# Patient Record
Sex: Male | Born: 1960 | Race: White | Hispanic: No | Marital: Married | Smoking: Former smoker
Health system: Southern US, Community
[De-identification: ages and names within clinical notes are randomized; demographics above are authoritative.]

## PROBLEM LIST (undated history)

## (undated) DIAGNOSIS — F1010 Alcohol abuse, uncomplicated: Secondary | ICD-10-CM

## (undated) DIAGNOSIS — J449 Chronic obstructive pulmonary disease, unspecified: Secondary | ICD-10-CM

## (undated) DIAGNOSIS — F32A Depression, unspecified: Secondary | ICD-10-CM

## (undated) DIAGNOSIS — E7800 Pure hypercholesterolemia, unspecified: Secondary | ICD-10-CM

## (undated) DIAGNOSIS — I6529 Occlusion and stenosis of unspecified carotid artery: Secondary | ICD-10-CM

## (undated) DIAGNOSIS — E663 Overweight: Secondary | ICD-10-CM

## (undated) DIAGNOSIS — IMO0002 Reserved for concepts with insufficient information to code with codable children: Secondary | ICD-10-CM

## (undated) DIAGNOSIS — E785 Hyperlipidemia, unspecified: Secondary | ICD-10-CM

## (undated) DIAGNOSIS — Z87891 Personal history of nicotine dependence: Secondary | ICD-10-CM

## (undated) DIAGNOSIS — F329 Major depressive disorder, single episode, unspecified: Secondary | ICD-10-CM

## (undated) DIAGNOSIS — Q231 Congenital insufficiency of aortic valve: Secondary | ICD-10-CM

## (undated) DIAGNOSIS — R011 Cardiac murmur, unspecified: Secondary | ICD-10-CM

## (undated) DIAGNOSIS — M545 Low back pain: Secondary | ICD-10-CM

## (undated) DIAGNOSIS — G8929 Other chronic pain: Secondary | ICD-10-CM

## (undated) DIAGNOSIS — R918 Other nonspecific abnormal finding of lung field: Secondary | ICD-10-CM

## (undated) DIAGNOSIS — Z953 Presence of xenogenic heart valve: Secondary | ICD-10-CM

## (undated) DIAGNOSIS — E041 Nontoxic single thyroid nodule: Secondary | ICD-10-CM

## (undated) DIAGNOSIS — I351 Nonrheumatic aortic (valve) insufficiency: Secondary | ICD-10-CM

## (undated) DIAGNOSIS — I350 Nonrheumatic aortic (valve) stenosis: Secondary | ICD-10-CM

## (undated) DIAGNOSIS — Z720 Tobacco use: Secondary | ICD-10-CM

## (undated) DIAGNOSIS — E559 Vitamin D deficiency, unspecified: Secondary | ICD-10-CM

## (undated) DIAGNOSIS — M5030 Other cervical disc degeneration, unspecified cervical region: Secondary | ICD-10-CM

## (undated) DIAGNOSIS — L0591 Pilonidal cyst without abscess: Secondary | ICD-10-CM

## (undated) DIAGNOSIS — A0472 Enterocolitis due to Clostridium difficile, not specified as recurrent: Secondary | ICD-10-CM

## (undated) HISTORY — DX: Other nonspecific abnormal finding of lung field: R91.8

## (undated) HISTORY — DX: Nonrheumatic aortic (valve) stenosis: I35.0

## (undated) HISTORY — DX: Other chronic pain: G89.29

## (undated) HISTORY — DX: Reserved for concepts with insufficient information to code with codable children: IMO0002

## (undated) HISTORY — DX: Tobacco use: Z72.0

## (undated) HISTORY — DX: Vitamin D deficiency, unspecified: E55.9

## (undated) HISTORY — PX: BACK SURGERY: SHX140

## (undated) HISTORY — DX: Chronic obstructive pulmonary disease, unspecified: J44.9

## (undated) HISTORY — DX: Alcohol abuse, uncomplicated: F10.10

## (undated) HISTORY — DX: Cardiac murmur, unspecified: R01.1

## (undated) HISTORY — DX: Depression, unspecified: F32.A

## (undated) HISTORY — DX: Nonrheumatic aortic (valve) insufficiency: I35.1

## (undated) HISTORY — DX: Major depressive disorder, single episode, unspecified: F32.9

## (undated) HISTORY — DX: Other cervical disc degeneration, unspecified cervical region: M50.30

## (undated) HISTORY — DX: Pilonidal cyst without abscess: L05.91

## (undated) HISTORY — DX: Enterocolitis due to Clostridium difficile, not specified as recurrent: A04.72

## (undated) HISTORY — DX: Hyperlipidemia, unspecified: E78.5

## (undated) HISTORY — DX: Congenital insufficiency of aortic valve: Q23.1

## (undated) HISTORY — DX: Personal history of nicotine dependence: Z87.891

## (undated) HISTORY — PX: HERNIA REPAIR: SHX51

## (undated) HISTORY — DX: Nontoxic single thyroid nodule: E04.1

## (undated) HISTORY — DX: Pure hypercholesterolemia, unspecified: E78.00

## (undated) HISTORY — DX: Overweight: E66.3

## (undated) HISTORY — DX: Occlusion and stenosis of unspecified carotid artery: I65.29

## (undated) HISTORY — DX: Low back pain: M54.5

---

## 2002-02-06 ENCOUNTER — Emergency Department (HOSPITAL_COMMUNITY): Admission: EM | Admit: 2002-02-06 | Discharge: 2002-02-06 | Payer: Self-pay

## 2012-04-29 ENCOUNTER — Encounter (INDEPENDENT_AMBULATORY_CARE_PROVIDER_SITE_OTHER): Payer: Self-pay | Admitting: General Surgery

## 2012-04-30 ENCOUNTER — Ambulatory Visit (INDEPENDENT_AMBULATORY_CARE_PROVIDER_SITE_OTHER): Payer: No Typology Code available for payment source | Admitting: General Surgery

## 2012-04-30 ENCOUNTER — Encounter (INDEPENDENT_AMBULATORY_CARE_PROVIDER_SITE_OTHER): Payer: Self-pay | Admitting: General Surgery

## 2012-04-30 VITALS — BP 122/78 | HR 71 | Temp 96.4°F | Resp 18 | Ht 72.0 in | Wt 189.6 lb

## 2012-04-30 DIAGNOSIS — L0591 Pilonidal cyst without abscess: Secondary | ICD-10-CM | POA: Insufficient documentation

## 2012-04-30 HISTORY — DX: Pilonidal cyst without abscess: L05.91

## 2012-04-30 NOTE — Progress Notes (Addendum)
Patient ID: [name] [name], male   DOB: [birth year], 52 y.o.   MRN: [id number]  Chief Complaint  Patient presents with  . New Evaluation    eval pilo cyst    HPI [name] [name] is a 52 y.o. male.   HPI 52 yo WM referred by Dr Caffie Damme at [facility] for evaluation of pilonidal disease. The patient states that he started having a problem in the area about 9 months ago. It occurred at the same time that he was hospitalized for an  Foreign abscess in the epidural space of the lumbar spine. He states he was in the intensive care unit for about a week at [facility]. He states that he received 8 weeks of IV antibiotics. He was followed by an infectious disease physician. During that time he had an area on his left medial blowout that became very swollen and tender. He was given an antibiotic which essentially resolved the swelling and tenderness. It essentially went away. Since that time he has had several flares of where he would get swollen and tender and draining occasionally. Right now he denies any pain or tenderness in the area. He states that it just feels like a solid lump right now. He denies any prior incision and drainage in the area. He denies any trauma to the area. He denies any fever or chills. He denies any weight loss. He does smoke about one pack per day. He no longer drinks. He has an upcoming echocardiogram to evaluate his aortic stenosis. He denies any diarrhea or constipation. Past Medical History  Diagnosis Date  . Depression   . Aortic stenosis   . DDD (degenerative disc disease), cervical   . Hypercholesteremia   . Heart murmur     Past Surgical History  Procedure Laterality Date  . Back surgery  1999/2003    Family History  Problem Relation Age of Onset  . Hyperlipidemia Father   . Heart disease Father   . Hyperlipidemia Mother   . Heart disease Mother   . Colon cancer Brother   . Heart disease Sister   . Cancer Brother     Colon  . Cancer Brother    Colon    Social History History  Substance Use Topics  . Smoking status: Current Every Day Smoker -- 1.00 packs/day    Types: Cigarettes  . Smokeless tobacco: Never Used  . Alcohol Use: No    Allergies  Allergen Reactions  . Morphine And Related     Patient states he wants nothing NARCOTIC r/t past abuse to drugs/alcohol.    Current Outpatient Prescriptions  Medication Sig Dispense Refill  . gabapentin (NEURONTIN) 800 MG tablet Take 800 mg by mouth 3 (three) times daily.      . MELOXICAM PO Take 1 tablet by mouth daily.      . rosuvastatin (CRESTOR) 10 MG tablet Take 10 mg by mouth daily.      Marland Kitchen venlafaxine XR (EFFEXOR-XR) 75 MG 24 hr capsule Take 75 mg by mouth daily.       No current facility-administered medications for this visit.    Review of Systems Review of Systems  Constitutional: Negative for fever, chills, appetite change and unexpected weight change.  HENT: Negative for congestion and trouble swallowing.   Eyes: Negative for visual disturbance.  Respiratory: Negative for chest tightness and shortness of breath.   Cardiovascular: Negative for chest pain and leg swelling.       No PND, no orthopnea, no  DOE  Gastrointestinal:       See HPI  Genitourinary: Negative for dysuria and hematuria.  Musculoskeletal: Negative.   Skin: Negative for rash.  Neurological: Negative for seizures and speech difficulty.       Had an episode of Left eye transient blindness a few months ago, lasted for less than 1 min then blurry for a few minutes and resolved; reports an episode of Rt arm weakness/numbness a few months ago that last 4-5 hours. "couldn't even hold a pen".   Hematological: Does not bruise/bleed easily.  Psychiatric/Behavioral: Negative for behavioral problems and confusion.    Blood pressure 122/78, pulse 71, temperature 96.4 F (35.8 C), temperature source Temporal, resp. rate 18, height 6' (1.829 m), weight 189 lb 9.6 oz (86.002 kg).  Physical Exam Physical  Exam  Vitals reviewed. Constitutional: He is oriented to person, place, and time. He appears well-developed and well-nourished. No distress.  HENT:  Head: Normocephalic and atraumatic.  Right Ear: External ear normal.  Eyes: Conjunctivae are normal. No scleral icterus.  Neck: No tracheal deviation present. No thyromegaly present.  Cardiovascular: Normal rate and regular rhythm.   Murmur (III/VI SEM) heard. Pulmonary/Chest: Effort normal. No respiratory distress. He has no wheezes.  Abdominal: Soft. He exhibits no distension. There is no tenderness.  Musculoskeletal: He exhibits no edema and no tenderness.  Lymphadenopathy:    He has no cervical adenopathy.  Neurological: He is alert and oriented to person, place, and time. He exhibits normal muscle tone.  Skin: Skin is warm and dry. No rash noted. He is not diaphoretic. No erythema.  In upper gluteal cleft, tiny <42mm sinus. About 1 cm to left of sinus is area of about 2cm of induration. No fluctuance/cellulitis. Fair amount of hair in area; lower back midline incision  Psychiatric: He has a normal mood and affect. His behavior is normal. Judgment and thought content normal.    Data Reviewed Dr Michaelle Copas note from 04/23/12  Assessment    Pilonidal sinus Possible Left amaurosis fugax and h/o TIA    Plan    He does have pilonidal disease. He was given Agricultural engineer. We discussed nonoperative as well as operative management. There is no current sign of infection or inflammation. With respect to nonoperative management we discussed keeping the area clean and dry. We also discussed trying to keep area free of hair by using an electric trimmer or a depilatory cream. With respect to surgery we discussed surgical excision of the skin and soft tissue in the area. I explained that there is no one standard how to approach this type of problem surgically. We discussed the most common complication after surgery of some type of wound healing  situation. We discussed the risk and benefits of surgery including but not limited to bleeding, infection, chronic wound, blood clot formation and anesthesia risk, scarring, recurrence, as well as the typical postoperative recovery course.  I recommend starting with nonoperative management. We discussed strategies.  In his review of systems, it appears that he may have had transient TIA and/or transient left eye blindness. I've encouraged him to followup with his primary care physician as well as his cardiologist regarding the symptoms he has had. We discussed their potential implication and what he should do should these recur.  I will leave his appointment open-ended. Followup as needed  I contacted and spoke with his primary care physician Dr. Katrinka Blazing and informed her of his positive review of systems for transient left eye blindness as well as  right upper extremity weakness.  Mary Sella. Andrey Campanile, MD, FACS General, Bariatric, & Minimally Invasive Surgery [facility], [place]        [facility] M 04/30/2012, 10:17 AM

## 2012-04-30 NOTE — Patient Instructions (Signed)
Talk with your cardiologist or primary care doctor about the Left eye blindness and right arm weakness  Pilonidal Cyst A pilonidal cyst occurs when hairs get trapped (ingrown) beneath the skin in the crease between the buttocks over your sacrum (the bone under that crease). Pilonidal cysts are most common in young men with a lot of body hair. When the cyst is ruptured (breaks) or leaking, fluid from the cyst may cause burning and itching. If the cyst becomes infected, it causes a painful swelling filled with pus (abscess). The pus and trapped hairs need to be removed (often by lancing) so that the infection can heal. However, recurrence is common and an operation may be needed to remove the cyst. HOME CARE INSTRUCTIONS   If the cyst was NOT INFECTED:  Keep the area clean and dry. Bathe or shower daily. Wash the area well with a germ-killing soap. Warm tub baths may help prevent infection and help with drainage. Dry the area well with a towel.  Avoid tight clothing to keep area as moisture free as possible.  Keep area between buttocks as free of hair as possible. A depilatory may be used.  If the cyst WAS INFECTED and needed to be drained:  Your caregiver packed the wound with gauze to keep the wound open. This allows the wound to heal from the inside outwards and continue draining.  Return for a wound check in 1 day or as suggested.  If you take tub baths or showers, repack the wound with gauze following them. Sponge baths (at the sink) are a good alternative.  If an antibiotic was ordered to fight the infection, take as directed.  Only take over-the-counter or prescription medicines for pain, discomfort, or fever as directed by your caregiver.  After the drain is removed, use sitz baths for 20 minutes 4 times per day. Clean the wound gently with mild unscented soap, pat dry, and then apply a dry dressing. SEEK MEDICAL CARE IF:   You have increased pain, swelling, redness, drainage, or  bleeding from the area.  You have a fever.  You have muscles aches, dizziness, or a general ill feeling. Document Released: 02/16/2000 Document Revised: 05/13/2011 Document Reviewed: 04/15/2008 [facility] Patient Information 2013 [place], [place].

## 2012-10-22 LAB — PULMONARY FUNCTION TEST

## 2012-11-30 ENCOUNTER — Encounter: Payer: Self-pay | Admitting: Thoracic Surgery (Cardiothoracic Vascular Surgery)

## 2012-11-30 ENCOUNTER — Institutional Professional Consult (permissible substitution) (INDEPENDENT_AMBULATORY_CARE_PROVIDER_SITE_OTHER): Payer: Medicare Other | Admitting: Thoracic Surgery (Cardiothoracic Vascular Surgery)

## 2012-11-30 ENCOUNTER — Telehealth: Payer: Self-pay | Admitting: *Deleted

## 2012-11-30 ENCOUNTER — Encounter: Payer: Self-pay | Admitting: Cardiovascular Disease

## 2012-11-30 VITALS — BP 146/86 | HR 67 | Resp 16 | Ht 72.0 in | Wt 185.0 lb

## 2012-11-30 DIAGNOSIS — F329 Major depressive disorder, single episode, unspecified: Secondary | ICD-10-CM

## 2012-11-30 DIAGNOSIS — I359 Nonrheumatic aortic valve disorder, unspecified: Secondary | ICD-10-CM

## 2012-11-30 DIAGNOSIS — M545 Low back pain, unspecified: Secondary | ICD-10-CM

## 2012-11-30 DIAGNOSIS — R5383 Other fatigue: Secondary | ICD-10-CM

## 2012-11-30 DIAGNOSIS — I350 Nonrheumatic aortic (valve) stenosis: Secondary | ICD-10-CM

## 2012-11-30 DIAGNOSIS — I351 Nonrheumatic aortic (valve) insufficiency: Secondary | ICD-10-CM | POA: Insufficient documentation

## 2012-11-30 DIAGNOSIS — F32A Depression, unspecified: Secondary | ICD-10-CM

## 2012-11-30 DIAGNOSIS — Z01818 Encounter for other preprocedural examination: Secondary | ICD-10-CM

## 2012-11-30 DIAGNOSIS — IMO0002 Reserved for concepts with insufficient information to code with codable children: Secondary | ICD-10-CM

## 2012-11-30 DIAGNOSIS — E785 Hyperlipidemia, unspecified: Secondary | ICD-10-CM

## 2012-11-30 DIAGNOSIS — E7800 Pure hypercholesterolemia, unspecified: Secondary | ICD-10-CM

## 2012-11-30 DIAGNOSIS — G8929 Other chronic pain: Secondary | ICD-10-CM

## 2012-11-30 DIAGNOSIS — Q231 Congenital insufficiency of aortic valve: Secondary | ICD-10-CM | POA: Insufficient documentation

## 2012-11-30 HISTORY — DX: Other chronic pain: G89.29

## 2012-11-30 HISTORY — DX: Nonrheumatic aortic (valve) stenosis: I35.0

## 2012-11-30 HISTORY — DX: Reserved for concepts with insufficient information to code with codable children: IMO0002

## 2012-11-30 HISTORY — DX: Hyperlipidemia, unspecified: E78.5

## 2012-11-30 NOTE — H&P (Signed)
[address]       [name] [name] [address]             [phone]     CARDIOTHORACIC SURGERY CONSULTATION REPORT  Referring Provider is Sherrill Raring, MD PCP is Caffie Damme, MD  Chief Complaint  Patient presents with  . Aortic Stenosis    Surgical eval on severe AS,TEE 11/24/12, 2D Echo 11/19/12    HPI:  Patient is a 52 year old disabled white male from [facility] with bicuspid aortic valve disease including aortic stenosis and aortic insufficiency who has been referred for possible elective surgical intervention.  The patient states that he was first noted to have a heart murmur on physical exam performed by his neurosurgeon in 1999. He has been followed by Dr. Hanley Hays for the last several years with serial echocardiograms which have revealed normal LV systolic function and severe aortic stenosis. Initially the patient remained asymptomatic, although recently he has noticed some changes in his exercise tolerance with mild exertional shortness of breath.  He underwent a stress echocardiogram 11/19/2012. Patient had poor exercise tolerance stopping only 4 1/2 minutes into a standard Bruce protocol due to shortness of breath.  Peak exercise echo findings demonstrated that the patient's transvalvular gradient across the aortic valve reportedly increased to greater than 90 mm mercury post exercise up from baseline 60-70 mm mercury.  The patient subsequently underwent transesophageal echocardiogram confirming the presence of bicuspid aortic valve with moderate to severe aortic stenosis and moderate to severe aortic regurgitation. Left ventricular size and systolic function appeared normal with ejection fraction estimated 60-65%. Patient has been referred for elective surgical consultation.  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 He is somewhat limited by chronic pain  but overall he states that he gets around the quite well.  Over the last couple of years he has appreciated significant worsening of exertional shortness of breath. He only gets short of breath with strenuous activity such as mowing the lawn. He does not get short of breath with normal activity. He has not had any chest pain or chest tightness either with activity or at rest. He denies any PND, orthopnea, or lower extremity edema. He has not had any dizzy spells or syncope.  Past Medical History  Diagnosis Date  . Depression   . Aortic stenosis   . DDD (degenerative disc disease), cervical   . Hypercholesteremia   . Heart murmur   . Bicuspid aortic valve   . Aortic insufficiency   . Alcohol abuse   . Vitamin D deficiency   . C. difficile colitis   . Overweight   . Thyroid nodule   . Former smoker   . Carotid stenosis     MILD  . Pilonidal cyst 04/30/2012  . Severe aortic stenosis 11/30/2012  . Degenerative disc disease 11/30/2012  . Hyperlipidemia 11/30/2012  . Chronic low back pain 11/30/2012  . Degenerative disc disease, lumbar 11/30/2012    Past Surgical History  Procedure Laterality Date  . Back surgery  1999/2003    Family History  Problem Relation Age of Onset  . Hyperlipidemia Father   . Heart disease Father   . Hyperlipidemia Mother   . Heart disease Mother   . Colon cancer Brother   . Heart disease Sister   . Cancer Brother     Colon  . Cancer Brother  Colon    History   Social History  . Marital Status: Married    Spouse Name: N/A    Number of Children: N/A  . Years of Education: N/A   Occupational History  . Not on file.   Social History Main Topics  . Smoking status: Current Every Day Smoker -- 1.00 packs/day for 25 years    Types: Cigarettes    Start date: 12/01/1987  . Smokeless tobacco: Never Used  . Alcohol Use: No  . Drug Use: Not on file  . Sexual Activity: Not on file   Other Topics Concern  . Not on file   Social History Narrative    . No narrative on file    Current Outpatient Prescriptions  Medication Sig Dispense Refill  . gabapentin (NEURONTIN) 800 MG tablet Take 800 mg by mouth 3 (three) times daily.      . MELOXICAM PO Take 1 tablet by mouth daily.      . rosuvastatin (CRESTOR) 10 MG tablet Take 10 mg by mouth daily.      Marland Kitchen venlafaxine XR (EFFEXOR-XR) 75 MG 24 hr capsule Take 75 mg by mouth daily.       No current facility-administered medications for this visit.    Allergies  Allergen Reactions  . Morphine And Related     Patient states he wants nothing NARCOTIC r/t past abuse to drugs/alcohol.      Review of Systems:   General:  normal appetite, normal energy, no weight gain, + weight loss, no fever  Cardiac:  no chest pain with exertion, no chest pain at rest, + SOB with strenuous exertion, no resting SOB, no PND, no orthopnea, no palpitations, no arrhythmia, no atrial fibrillation, no LE edema, no dizzy spells, no syncope  Respiratory:  + exertional shortness of breath, no home oxygen, no productive cough, + chronic dry cough, no bronchitis, no wheezing, no hemoptysis, no asthma, no pain with inspiration or cough, no sleep apnea, no no CPAP at night  GI:   no difficulty swallowing, no reflux, no frequent heartburn, no hiatal hernia, no abdominal pain, no constipation, no diarrhea, no hematochezia, no hematemesis, no melena  GU:   no dysuria,  no frequency, no urinary tract infection, no hematuria, no enlarged prostate, no kidney stones, no kidney disease  Vascular:  no pain suggestive of claudication, no pain in feet, + leg cramps, no varicose veins, no DVT, no non-healing foot ulcer  Neuro:   no stroke, no TIA's, no seizures, no headaches, no temporary blindness one eye,  no slurred speech, no peripheral neuropathy, + chronic pain, no instability of gait, no memory/cognitive dysfunction  Musculoskeletal: + arthritis in back and neck, no joint swelling, no myalgias, no difficulty walking, normal mobility    Skin:   no rash, no itching, no skin infections, no pressure sores or ulcerations  Psych:   no anxiety, + depression, no nervousness, no unusual recent stress  Eyes:   no blurry vision, no floaters, no recent vision changes, does not wear glasses or contacts  ENT:   no hearing loss, no loose or painful teeth, no dentures, last saw dentist 2011  Hematologic:  no easy bruising, no abnormal bleeding, no clotting disorder, no frequent epistaxis  Endocrine:  no diabetes, does not check CBG's at home     Physical Exam:   BP 146/86  Pulse 67  Resp 16  Ht 6' (1.829 m)  Wt 185 lb (83.915 kg)  BMI 25.08 kg/m2  SpO2 98%  General:    well-appearing  HEENT:  Unremarkable   Neck:   no JVD, no bruits, no adenopathy   Chest:   clear to auscultation, symmetrical breath sounds, no wheezes, no rhonchi   CV:   RRR, grade III/VI crescendo/decrescendo systolic murmur and early diastolic murmur  Abdomen:  soft, non-tender, no masses   Extremities:  warm, well-perfused, pulses diminished but palpable, no LE edema  Rectal/GU  Deferred  Neuro:   Grossly non-focal and symmetrical throughout  Skin:   Clean and dry, no rashes, no breakdown   Diagnostic Tests:  TRANSESOPHAGEAL ECHOCARDIOGRAM  Both the images and the report from transesophageal echocardiogram performed 11/24/2012 are reviewed. The patient has congenitally bicuspid aortic valve with moderate to severe aortic stenosis and at least moderate aortic insufficiency. Left ventricular size and systolic function appear normal.  Ejection fraction was estimated 60-65%. Peak velocity across the aortic valve was measured 3.8 m/s.The peak and mean transvalvular gradients were estimated to be 59 and 31 mm mercury respectively. Valve area by planimetry was approximately 1.0 cm.  The valve leaflets were relatively thin but demonstrated decreased leaflet mobility and fusion of the left and right cusps of the valve. The aortic root appeared somewhat  enlarged.    STRESS ECHOCARDIOGRAM  The patient reportedly exercised for 4 minutes 30 seconds into a standard Bruce protocol at which time the patient achieved a maximum heart rate of 125 beats per minute. The test was stopped because of severe shortness of breath. The patient's baseline transvalvular gradient was reportedly 60-70 mm mercury, and this increased to greater than 90 mm mercury post exercise. Left ventricular systolic function appeared normal with ejection fraction 60%. No wall motion abnormalities were seen with exercise. There were no ST T wave changes seen with exercise by ECG.   Impression:  The patient has congenitally bicuspid aortic valve with severe symptomatic aortic stenosis and at least moderate aortic insufficiency. Left ventricular systolic function appears preserved. I agree that it makes sense to proceed with elective aortic valve replacement in the near future.   Plan:  The patient was counseled at length regarding surgical alternatives with respect to valve replacement including continued medical therapy versus proceeding with conventional surgical aortic valve replacement using either a mechanical prosthesis or a bioprosthetic tissue valve.  Other alternatives including the Ross autograft procedure, homograft aortic root replacement, stentless bioprosthetic tissue valve replacement, valve repair, and transcatheter aortic valve replacement were discussed.  Discussion was held comparing the relative risks of mechanical valve replacement with need for lifelong anticoagulation versus use of a bioprosthetic tissue valve and the associated potential for late structural valve deterioration in failure.  The patient will undergo left and right heart catheterization in the near future by Dr. Allyson Sabal. We will obtain CT angiogram of the chest abdomen and pelvis to rule out the possibility of significant aneurysmal enlargement of the ascending thoracic aorta and to investigate access  options for possible enema invasive approach for surgery. The patient also undergo pulmonary function testing. He has been reminded how important it will be for him to quit smoking. He also has been reminded to see his dentist in the near future for routine dental cleaning and exam.     [name]. Cornelius Moras, MD 11/30/2012 2:53 PM

## 2012-11-30 NOTE — Telephone Encounter (Signed)
Dr Allyson Sabal spoke with Dr Barry Dienes and wanted Dr Allyson Sabal to do a right and left heart cath on Mr [name] [name]. I spoke with Mr Boeke and verified that he is ready to proceed with the cath.  He verbalized understanding and is ready to proceed.

## 2012-11-30 NOTE — Patient Instructions (Addendum)
Schedule left and right heart cath with Dr Allyson Sabal as soon as practical  Have routine dental exam and cleaning as soon as practical

## 2012-12-01 ENCOUNTER — Encounter (HOSPITAL_COMMUNITY): Payer: Self-pay | Admitting: Pharmacy Technician

## 2012-12-01 ENCOUNTER — Other Ambulatory Visit: Payer: Self-pay | Admitting: *Deleted

## 2012-12-01 DIAGNOSIS — I350 Nonrheumatic aortic (valve) stenosis: Secondary | ICD-10-CM

## 2012-12-01 DIAGNOSIS — I719 Aortic aneurysm of unspecified site, without rupture: Secondary | ICD-10-CM

## 2012-12-03 ENCOUNTER — Other Ambulatory Visit: Payer: Self-pay | Admitting: *Deleted

## 2012-12-03 ENCOUNTER — Encounter: Payer: Self-pay | Admitting: *Deleted

## 2012-12-03 DIAGNOSIS — Z79899 Other long term (current) drug therapy: Secondary | ICD-10-CM

## 2012-12-08 ENCOUNTER — Inpatient Hospital Stay (HOSPITAL_COMMUNITY)
Admission: RE | Admit: 2012-12-08 | Discharge: 2012-12-08 | Disposition: A | Discharge status: Home / Self Care | Payer: Medicare Other | Source: Ambulatory Visit | Attending: Thoracic Surgery (Cardiothoracic Vascular Surgery) | Admitting: Thoracic Surgery (Cardiothoracic Vascular Surgery)

## 2012-12-08 ENCOUNTER — Ambulatory Visit (HOSPITAL_COMMUNITY)
Admission: RE | Admit: 2012-12-08 | Discharge: 2012-12-08 | Disposition: A | Discharge status: Home / Self Care | Payer: Medicare Other | Source: Ambulatory Visit | Attending: Thoracic Surgery (Cardiothoracic Vascular Surgery) | Admitting: Thoracic Surgery (Cardiothoracic Vascular Surgery)

## 2012-12-08 ENCOUNTER — Encounter (HOSPITAL_COMMUNITY): Payer: Self-pay

## 2012-12-08 DIAGNOSIS — I719 Aortic aneurysm of unspecified site, without rupture: Secondary | ICD-10-CM

## 2012-12-08 DIAGNOSIS — I350 Nonrheumatic aortic (valve) stenosis: Secondary | ICD-10-CM

## 2012-12-08 DIAGNOSIS — E041 Nontoxic single thyroid nodule: Secondary | ICD-10-CM | POA: Insufficient documentation

## 2012-12-08 DIAGNOSIS — I359 Nonrheumatic aortic valve disorder, unspecified: Secondary | ICD-10-CM | POA: Insufficient documentation

## 2012-12-08 DIAGNOSIS — R918 Other nonspecific abnormal finding of lung field: Secondary | ICD-10-CM

## 2012-12-08 DIAGNOSIS — K5730 Diverticulosis of large intestine without perforation or abscess without bleeding: Secondary | ICD-10-CM | POA: Insufficient documentation

## 2012-12-08 DIAGNOSIS — R059 Cough, unspecified: Secondary | ICD-10-CM | POA: Insufficient documentation

## 2012-12-08 DIAGNOSIS — R05 Cough: Secondary | ICD-10-CM | POA: Insufficient documentation

## 2012-12-08 DIAGNOSIS — R0602 Shortness of breath: Secondary | ICD-10-CM | POA: Insufficient documentation

## 2012-12-08 HISTORY — DX: Other nonspecific abnormal finding of lung field: R91.8

## 2012-12-08 LAB — PULMONARY FUNCTION TEST

## 2012-12-08 MED ORDER — IOHEXOL 350 MG/ML SOLN
100.0000 mL | Freq: Once | INTRAVENOUS | Status: AC | PRN
  Administered 2012-12-08: 100 mL via INTRAVENOUS

## 2012-12-08 MED ORDER — ALBUTEROL SULFATE (5 MG/ML) 0.5% IN NEBU
2.5000 mg | INHALATION_SOLUTION | Freq: Once | RESPIRATORY_TRACT | Status: AC
  Administered 2012-12-08: 2.5 mg via RESPIRATORY_TRACT
  Filled 2012-12-08: qty 0.5

## 2012-12-10 ENCOUNTER — Ambulatory Visit (HOSPITAL_COMMUNITY)
Admission: RE | Admit: 2012-12-10 | Discharge: 2012-12-10 | Disposition: A | Discharge status: Home / Self Care | Payer: Medicare Other | Source: Ambulatory Visit | Attending: Cardiovascular Disease | Admitting: Cardiovascular Disease

## 2012-12-10 ENCOUNTER — Encounter (HOSPITAL_COMMUNITY)
Admission: RE | Disposition: A | Discharge status: Home / Self Care | Payer: Self-pay | Source: Ambulatory Visit | Attending: Cardiovascular Disease

## 2012-12-10 DIAGNOSIS — E785 Hyperlipidemia, unspecified: Secondary | ICD-10-CM | POA: Diagnosis present

## 2012-12-10 DIAGNOSIS — I351 Nonrheumatic aortic (valve) insufficiency: Secondary | ICD-10-CM | POA: Diagnosis present

## 2012-12-10 DIAGNOSIS — Q2381 Bicuspid aortic valve: Secondary | ICD-10-CM

## 2012-12-10 DIAGNOSIS — I2510 Atherosclerotic heart disease of native coronary artery without angina pectoris: Secondary | ICD-10-CM

## 2012-12-10 DIAGNOSIS — R599 Enlarged lymph nodes, unspecified: Secondary | ICD-10-CM | POA: Insufficient documentation

## 2012-12-10 DIAGNOSIS — I080 Rheumatic disorders of both mitral and aortic valves: Secondary | ICD-10-CM | POA: Insufficient documentation

## 2012-12-10 DIAGNOSIS — G8929 Other chronic pain: Secondary | ICD-10-CM | POA: Diagnosis present

## 2012-12-10 DIAGNOSIS — IMO0002 Reserved for concepts with insufficient information to code with codable children: Secondary | ICD-10-CM | POA: Diagnosis present

## 2012-12-10 DIAGNOSIS — R9439 Abnormal result of other cardiovascular function study: Secondary | ICD-10-CM | POA: Diagnosis present

## 2012-12-10 DIAGNOSIS — R591 Generalized enlarged lymph nodes: Secondary | ICD-10-CM | POA: Diagnosis present

## 2012-12-10 DIAGNOSIS — I723 Aneurysm of iliac artery: Secondary | ICD-10-CM | POA: Diagnosis present

## 2012-12-10 DIAGNOSIS — I359 Nonrheumatic aortic valve disorder, unspecified: Secondary | ICD-10-CM

## 2012-12-10 DIAGNOSIS — Q231 Congenital insufficiency of aortic valve: Secondary | ICD-10-CM | POA: Insufficient documentation

## 2012-12-10 DIAGNOSIS — R918 Other nonspecific abnormal finding of lung field: Secondary | ICD-10-CM | POA: Diagnosis present

## 2012-12-10 DIAGNOSIS — I350 Nonrheumatic aortic (valve) stenosis: Secondary | ICD-10-CM | POA: Diagnosis present

## 2012-12-10 DIAGNOSIS — F329 Major depressive disorder, single episode, unspecified: Secondary | ICD-10-CM | POA: Diagnosis present

## 2012-12-10 DIAGNOSIS — Z79899 Other long term (current) drug therapy: Secondary | ICD-10-CM

## 2012-12-10 DIAGNOSIS — F32A Depression, unspecified: Secondary | ICD-10-CM | POA: Diagnosis present

## 2012-12-10 DIAGNOSIS — M545 Low back pain, unspecified: Secondary | ICD-10-CM | POA: Diagnosis present

## 2012-12-10 DIAGNOSIS — Z8249 Family history of ischemic heart disease and other diseases of the circulatory system: Secondary | ICD-10-CM

## 2012-12-10 DIAGNOSIS — I739 Peripheral vascular disease, unspecified: Secondary | ICD-10-CM

## 2012-12-10 HISTORY — PX: CARDIAC CATHETERIZATION: SHX172

## 2012-12-10 HISTORY — PX: RIGHT HEART CATHETERIZATION: SHX5447

## 2012-12-10 LAB — BASIC METABOLIC PANEL
BUN: 15 mg/dL (ref 6–23)
Chloride: 103 mEq/L (ref 96–112)
Creatinine, Ser: 1.04 mg/dL (ref 0.50–1.35)
GFR calc Af Amer: >90 mL/min (ref >90)
GFR calc non Af Amer: 80 mL/min — ABNORMAL LOW (ref >90)
Glucose, Bld: 104 mg/dL — ABNORMAL HIGH (ref 70–99)

## 2012-12-10 LAB — POCT I-STAT 3, VENOUS BLOOD GAS (G3P V)
Acid-base deficit: 2 mmol/L (ref 0.0–2.0)
Bicarbonate: 25.5 mEq/L — ABNORMAL HIGH (ref 20.0–24.0)
Bicarbonate: 24.8 mEq/L — ABNORMAL HIGH (ref 20.0–24.0)
O2 Saturation: 63 %
TCO2: 27 mmol/L (ref 0–100)
TCO2: 26 mmol/L (ref 0–100)
pH, Ven: 7.322 — ABNORMAL HIGH (ref 7.250–7.300)
pO2, Ven: 36 mmHg (ref 30.0–45.0)

## 2012-12-10 LAB — CBC
HCT: 45.3 % (ref 39.0–52.0)
Hemoglobin: 16.5 g/dL (ref 13.0–17.0)
MCHC: 36.4 g/dL — ABNORMAL HIGH (ref 30.0–36.0)
MCV: 88.5 fL (ref 78.0–100.0)
RDW: 13 % (ref 11.5–15.5)
WBC: 9.5 10*3/uL (ref 4.0–10.5)

## 2012-12-10 LAB — PROTIME-INR: INR: 0.9 (ref 0.00–1.49)

## 2012-12-10 SURGERY — RIGHT HEART CATH

## 2012-12-10 MED ORDER — MIDAZOLAM HCL 2 MG/2ML IJ SOLN
INTRAMUSCULAR | Status: AC
  Filled 2012-12-10: qty 2

## 2012-12-10 MED ORDER — FENTANYL CITRATE 0.05 MG/ML IJ SOLN
INTRAMUSCULAR | Status: AC
  Filled 2012-12-10: qty 2

## 2012-12-10 MED ORDER — DIAZEPAM 5 MG PO TABS
5.0000 mg | ORAL_TABLET | ORAL | Status: AC
  Administered 2012-12-10: 5 mg via ORAL

## 2012-12-10 MED ORDER — NITROGLYCERIN 0.2 MG/ML ON CALL CATH LAB
INTRAVENOUS | Status: AC
  Filled 2012-12-10: qty 1

## 2012-12-10 MED ORDER — ASPIRIN 81 MG PO CHEW
81.0000 mg | CHEWABLE_TABLET | ORAL | Status: AC
  Administered 2012-12-10: 81 mg via ORAL

## 2012-12-10 MED ORDER — HEPARIN (PORCINE) IN NACL 2-0.9 UNIT/ML-% IJ SOLN
INTRAMUSCULAR | Status: AC
  Filled 2012-12-10: qty 1000

## 2012-12-10 MED ORDER — LIDOCAINE-EPINEPHRINE 1 %-1:100000 IJ SOLN
INTRAMUSCULAR | Status: AC
  Filled 2012-12-10: qty 1

## 2012-12-10 MED ORDER — SODIUM CHLORIDE 0.9 % IV SOLN
INTRAVENOUS | Status: DC
  Administered 2012-12-10: 09:00:00 via INTRAVENOUS

## 2012-12-10 MED ORDER — SODIUM CHLORIDE 0.9 % IJ SOLN: 3.0000 mL | INTRAMUSCULAR | Status: DC | PRN

## 2012-12-10 MED ORDER — LIDOCAINE HCL (PF) 1 % IJ SOLN
INTRAMUSCULAR | Status: AC
  Filled 2012-12-10: qty 30

## 2012-12-10 NOTE — H&P (Signed)
    Pt was reexamined and existing H & P reviewed. No changes found.  Runell Gess, MD [facility] 12/10/2012 10:38 AM

## 2012-12-10 NOTE — CV Procedure (Addendum)
[name] [name] is a 52 y.o. male    [id number] LOCATION:  FACILITY: [facility]  PHYSICIAN: Nanetta Batty, M.D. 07/04/1960   DATE OF PROCEDURE:  12/10/2012  DATE OF DISCHARGE:     CARDIAC CATHETERIZATION     History obtained from chart review.52 y/o with a history of a heart murmur and DOE. He has been followed by Dr Julianne Handler for bicuspid AOV with AS/MR. The pt denies chest pain. He has had DOE. A recent stress echo was abnormal with mod-severe AS and moderate MR and decreased exercise tolerance. TEE was performed after this. He has been referred for AVR and was seen by Dr Cornelius Moras 11/30/12. He is admitted now for Rt and Lt heart cath.  He denies any history of angina or syncope. He says he had a negative Myoview a year or so ago. He has a strong family history of CAD- his father and sister died of an MI in their 6s. He smokes a pack a day. He is disabled secondary to chronic back pain. 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 CT done 10/7 showed pulmonary nodules, pelvic lymphadenopathy, and bilateral iliac aneurysms    PROCEDURE DESCRIPTION:    The patient was brought to the second floor [place] [facility] cath lab in the postabsorptive state. He was premedicated with Valium 5 mg by mouth, IV Versed and fentanyl. His left groinwas prepped and shaved in usual sterile fashion. Xylocaine 1% was used for local anesthesia. A 5 French sheath was inserted into the left common femoral artery using standard Seldinger technique. A 7 French sheath was inserted into the left common femoral vein using standard Seldinger technique. A 7 French balloon tip thermal dilution Swan-Ganz catheter was then advanced to the right heart chambers obtaining sequential pressures, blood samples and thermal dilution cardiac output was obtained. A total of 80 cc of contrast was administered to the patient   HEMODYNAMICS:    AO SYSTOLIC/AO DIASTOLIC: 131/76   RA  pressure: 13/11, mean = 9  RV pressure: 37/10  Pulmonary artery pressure: 34/0, mean equals 23  Pulmonary capillary wedge pressure:24/22, mean equals 21  Cardiac output (Fick) equals 3.32 L per minute, 1.6 L per minute per meter squared  Cardiac output (demolition) 2.97 L per minute, 1.43 L per minute per meter squared  ANGIOGRAPHIC RESULTS:   1. Left main; normal  2. LAD; the LAD gave off a small first diagonal branch and a small to medium size second I will branch which had a 50-60% proximal stenosis 3. Left circumflex; codominant and free of significant disease. There was a small ramus branch that had diffuse moderate segmental proximal disease.  4. Right coronary artery; codominant with some aneurysmal dilatation in its proximal to midportion but no obstructive disease 5. Left ventriculography;not performed today 6: Abdominal aortogram-the distal abdominal aorta was widely patent. The iliac bifurcation, common and external iliac arteries were free of significant atherosclerotic or aneurysmal disease.  IMPRESSION:Mr. 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 Abdomen aortogram showed widely patent iliacs suitable for TAVR if necessary. The sheath were removed and pressure was held on the groin to achieve hemostasis. The patient left the lab in stable condition. He will be discharged home after remaining recumbent for 6 hours. Dsr. Ray Hanley Hays and Ashley Mariner  were notified of these results.   Runell Gess MD, [facility] 12/10/2012 11:31 AM

## 2012-12-10 NOTE — H&P (Signed)
Patient ID: [name] [name] MRN: [id number], DOB/AGE: April 12, 1960   Admit date: 12/10/2012   Primary Physician: Caffie Damme, MD Primary Cardiologist: Dr Julianne Handler  HPI:   52 y/o with a history of a heart murmur and DOE. He has been followed by Dr Julianne Handler for bicuspid AOV with AS/MR. The pt denies chest pain. He has had DOE. A recent stress echo was abnormal with mod-severe AS and moderate MR and decreased exercise tolerance. TEE was performed after this. He has been referred for AVR and was seen by Dr Cornelius Moras 11/30/12. He is admitted now for Rt and Lt heart cath.            He denies any history of angina or syncope. He says he had a negative Myoview a year or so ago. He has a strong family history of CAD- his father and sister died of an MI in their 74s. He smokes a pack a day. He is disabled secondary to chronic back pain. 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 CT done 10/7 showed pulmonary nodules, pelvic lymphadenopathy, and bilateral iliac aneurysms.    Problem List: Past Medical History  Diagnosis Date  . Depression   . Aortic stenosis   . DDD (degenerative disc disease), cervical   . Hypercholesteremia   . Heart murmur   . Bicuspid aortic valve   . Aortic insufficiency   . Alcohol abuse   . Vitamin D deficiency   . C. difficile colitis   . Overweight   . Thyroid nodule   . Former smoker   . Carotid stenosis     MILD  . Pilonidal cyst 04/30/2012  . Severe aortic stenosis 11/30/2012  . Degenerative disc disease 11/30/2012  . Hyperlipidemia 11/30/2012  . Chronic low back pain 11/30/2012  . Degenerative disc disease, lumbar 11/30/2012    Past Surgical History  Procedure Laterality Date  . Back surgery  1999/2003     Allergies:  Allergies  Allergen Reactions  . Morphine And Related     Patient states he wants nothing NARCOTIC r/t past abuse to drugs/alcohol.     Home Medications Current Facility-Administered Medications   Medication Dose Route Frequency Provider Last Rate Last Dose  . [START ON 12/11/2012] 0.9 %  sodium chloride infusion   Intravenous Continuous Runell Gess, MD 75 mL/hr at 12/10/12 [phone]    . sodium chloride 0.9 % injection 3 mL  3 mL Intravenous PRN Runell Gess, MD         Family History  Problem Relation Age of Onset  . Hyperlipidemia Father   . Heart disease Father   . Hyperlipidemia Mother   . Heart disease Mother   . Colon cancer Brother   . Heart disease Sister   . Cancer Brother     Colon  . Cancer Brother     Colon     History   Social History  . Marital Status: Married    Spouse Name: N/A    Number of Children: N/A  . Years of Education: N/A   Occupational History  . Not on file.   Social History Main Topics  . Smoking status: Current Every Day Smoker -- 1.00 packs/day for 25 years    Types: Cigarettes    Start date: 12/01/1987  . Smokeless tobacco: Never Used  . Alcohol Use: No  . Drug Use: Not on file  . Sexual Activity: Not on file  Other Topics Concern  . Not on file   Social History Narrative  . No narrative on file     Review of Systems: General: negative for chills, fever, night sweats or weight changes.  Chronic back pain Cardiovascular: negative for chest pain, edema, orthopnea, palpitations, paroxysmal nocturnal dyspnea  Dermatological: negative for rash Respiratory: negative for cough or wheezing Urologic: negative for hematuria Abdominal: negative for nausea, vomiting, diarrhea, bright red blood per rectum, melena, or hematemesis Neurologic: negative for visual changes, syncope, or dizziness All other systems reviewed and are otherwise negative except as noted above.  Physical Exam: Blood pressure 145/74, pulse 62, temperature 97.5 F (36.4 C), temperature source Oral, resp. rate 20, height 6' (1.829 m), weight 188 lb (85.276 kg), SpO2 99.00%.  General appearance: alert, cooperative, appears stated age and no  distress Neck: bilateral transmitted murmur to carotids Lungs: clear to auscultation bilaterally Heart: regular rate and rhythm and 2/6 systolic murmur Aov, 1-2/ 6 AI murmur Abdomen: soft, non-tender; bowel sounds normal; no masses,  no organomegaly Extremities: extremities normal, atraumatic, no cyanosis or edema Pulses: 2+ and symmetric Skin: Skin color, texture, turgor normal. No rashes or lesions Neurologic: Grossly normal    Labs:   Results for orders placed during the hospital encounter of 12/10/12 (from the past 24 hour(s))  CBC     Status: Abnormal   Collection Time    12/10/12  9:12 AM      Result Value Range   WBC 9.5  4.0 - 10.5 K/uL   RBC 5.12  4.22 - 5.81 MIL/uL   Hemoglobin 16.5  13.0 - 17.0 g/dL   HCT 40.9  81.1 - 91.4 %   MCV 88.5  78.0 - 100.0 fL   MCH 32.2  26.0 - 34.0 pg   MCHC 36.4 (*) 30.0 - 36.0 g/dL   RDW 78.2  95.6 - 21.3 %   Platelets 156  150 - 400 K/uL  BASIC METABOLIC PANEL     Status: Abnormal   Collection Time    12/10/12  9:12 AM      Result Value Range   Sodium 137  135 - 145 mEq/L   Potassium 4.6  3.5 - 5.1 mEq/L   Chloride 103  96 - 112 mEq/L   CO2 22  19 - 32 mEq/L   Glucose, Bld 104 (*) 70 - 99 mg/dL   BUN 15  6 - 23 mg/dL   Creatinine, Ser 0.86  0.50 - 1.35 mg/dL   Calcium 9.1  8.4 - 57.8 mg/dL   GFR calc non Af Amer 80 (*) >90 mL/min   GFR calc Af Amer >90  >90 mL/min  PROTIME-INR     Status: None   Collection Time    12/10/12  9:12 AM      Result Value Range   Prothrombin Time 12.0  11.6 - 15.2 seconds   INR 0.90  0.00 - 1.49     Radiology/Studies: Ct Angio Chest Aorta W/cm &/or Wo/cm  12/08/2012   CLINICAL DATA:  Cough and short of breath  EXAM: CT ANGIOGRAPHY CHEST, ABDOMEN AND PELVIS  TECHNIQUE: Multidetector CT imaging through the chest, abdomen and pelvis was performed using the standard protocol during bolus administration of intravenous contrast. Multiplanar reconstructed images including MIPs were obtained and reviewed  to evaluate the vascular anatomy.  CONTRAST:  OMNIPAQUE IOHEXOL 350 MG/ML SOLN  COMPARISON:  None.  FINDINGS: CTA CHEST FINDINGS  Pre contrast images demonstrate no evidence of intramural hematoma. There  is pronounced calcification involving all leaflets of the aortic valve. Mild 3 vessel coronary artery calcifications. Minimal mitral annular calcification.  Aortic diameters at the sinus of Valsalva, sino-tubular junction, and ascending aorta are 3.5 cm, 2.7 cm, and 3.7 cm respectively. No evidence of aneurysm or dissection.  Innominate artery, right subclavian artery, right common carotid artery, left common carotid artery, left subclavian artery are widely patent. Significant narrowing at the origin of the left vertebral artery is suggested. The right vertebral artery is patent.  No obvious filling defects in the pulmonary arterial tree to suggest acute pulmonary thromboembolism.  Enlargement of the left ventricle with left ventricular muscular hypertrophy is noted.  No pericardial effusion. No abnormal adenopathy by measurement criteria.  No pneumothorax. No pleural effusion.  3 mm right upper lobe pulmonary nodule on image 57. 4 mm right upper lobe nodule on image 67. 5 mm visceral pleural node on the left on image 60.  No acute bony deformity.  Heterogeneous 7 mm right thyroid nodule on image 2.  Review of the MIP images confirms the above findings.  CTA ABDOMEN AND PELVIS FINDINGS  The aorta is non aneurysmal and patent. Little if any plaque in the suprarenal are juxtarenal aorta. There is irregular circumferential plaque in the infrarenal aorta without significant narrowing.  Celiac axis is patent.  Branch vessels are patent.  SMA is patent.  Branch vessels are patent.  A single right renal artery and 2 left renal arteries are patent.  There is cortical narrowing at the origin of the IMA. Branch vessels are patent.  Atherosclerotic changes of the right common iliac artery without significant narrowing.  It is mildly ectatic at 10 mm. Right internal and external iliac arteries are patent. Right external iliac artery is ectatic at 10 mm.  There is irregular plaque within the left common iliac artery without significant narrowing. It is aneurysmal measuring 17 mm in caliber. Left external iliac artery is patent with some plaque at its origin. There is significant narrowing at the origin of the left internal iliac artery with post stenotic dilatation measuring up to 10 mm.  Proximal femoral vasculature is patent.  The liver, gallbladder, spleen, adrenal glands, kidneys are within normal limits.  Normal appendix. Minimal diverticulosis of the sigmoid colon without acute diverticulitis  Mild bladder wall thickening is nonspecific. Unremarkable prostate.  Mild mesenteric adenopathy is present. Several small bowel mesenteric lymph nodes are visualized. The largest is 13 mm in short axis diameter. 10 mm right obturator node on image 273. Small para-aortic nodes.  Postoperative changes in the lumbar spine are noted. Severe degenerative disc disease and endplate changes at L2-3. L4-5 fusion. No vertebral compression deformity.  Review of the MIP images confirms the above findings.  IMPRESSION: CTA CHEST IMPRESSION  No evidence of aortic aneurysm or dissection.  Significant narrowing at the origin of the vertebral artery is suggested.  4 mm right upper lobe pulmonary nodules. If the patient is at high risk for bronchogenic carcinoma, follow-up chest CT at 1year is recommended. If the patient is at low risk, no follow-up is needed. This recommendation follows the consensus statement: Guidelines for Management of Small Pulmonary Nodules Detected on CT Scans: A Statement from the Fleischner Society as published in Radiology 2005; 237:395-400.  Extensive aortic valve calcification and left ventricular hypertrophy consistent with a history of aortic stenosis.  7 mm right thyroid nodule.  CTA ABDOMEN AND PELVIS IMPRESSION  No  evidence of aorto occlusive disease or iliac artery narrowing to  results and arterial insufficiency.  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  Mild abnormal adenopathy in the small bowel mesentery. The lymphoproliferative process cannot be excluded. PET-CT may be helpful as clinically indicated. At a minimum, 3-6 month followup is recommended to ensure stability.   Electronically Signed   By: Maryclare Bean M.D.   On: 12/08/2012 13:25   Ct Angio Abd/pel W/ And/or W/o  12/08/2012   CLINICAL DATA:  Cough and short of breath  EXAM: CT ANGIOGRAPHY CHEST, ABDOMEN AND PELVIS  TECHNIQUE: Multidetector CT imaging through the chest, abdomen and pelvis was performed using the standard protocol during bolus administration of intravenous contrast. Multiplanar reconstructed images including MIPs were obtained and reviewed to evaluate the vascular anatomy.  CONTRAST:  OMNIPAQUE IOHEXOL 350 MG/ML SOLN  COMPARISON:  None.  FINDINGS: CTA CHEST FINDINGS  Pre contrast images demonstrate no evidence of intramural hematoma. There is pronounced calcification involving all leaflets of the aortic valve. Mild 3 vessel coronary artery calcifications. Minimal mitral annular calcification.  Aortic diameters at the sinus of Valsalva, sino-tubular junction, and ascending aorta are 3.5 cm, 2.7 cm, and 3.7 cm respectively. No evidence of aneurysm or dissection.  Innominate artery, right subclavian artery, right common carotid artery, left common carotid artery, left subclavian artery are widely patent. Significant narrowing at the origin of the left vertebral artery is suggested. The right vertebral artery is patent.  No obvious filling defects in the pulmonary arterial tree to suggest acute pulmonary thromboembolism.  Enlargement of the left ventricle with left ventricular muscular hypertrophy is noted.  No pericardial effusion. No abnormal adenopathy by measurement  criteria.  No pneumothorax. No pleural effusion.  3 mm right upper lobe pulmonary nodule on image 57. 4 mm right upper lobe nodule on image 67. 5 mm visceral pleural node on the left on image 60.  No acute bony deformity.  Heterogeneous 7 mm right thyroid nodule on image 2.  Review of the MIP images confirms the above findings.  CTA ABDOMEN AND PELVIS FINDINGS  The aorta is non aneurysmal and patent. Little if any plaque in the suprarenal are juxtarenal aorta. There is irregular circumferential plaque in the infrarenal aorta without significant narrowing.  Celiac axis is patent.  Branch vessels are patent.  SMA is patent.  Branch vessels are patent.  A single right renal artery and 2 left renal arteries are patent.  There is cortical narrowing at the origin of the IMA. Branch vessels are patent.  Atherosclerotic changes of the right common iliac artery without significant narrowing. It is mildly ectatic at 10 mm. Right internal and external iliac arteries are patent. Right external iliac artery is ectatic at 10 mm.  There is irregular plaque within the left common iliac artery without significant narrowing. It is aneurysmal measuring 17 mm in caliber. Left external iliac artery is patent with some plaque at its origin. There is significant narrowing at the origin of the left internal iliac artery with post stenotic dilatation measuring up to 10 mm.  Proximal femoral vasculature is patent.  The liver, gallbladder, spleen, adrenal glands, kidneys are within normal limits.  Normal appendix. Minimal diverticulosis of the sigmoid colon without acute diverticulitis  Mild bladder wall thickening is nonspecific. Unremarkable prostate.  Mild mesenteric adenopathy is present. Several small bowel mesenteric lymph nodes are visualized. The largest is 13 mm in short axis diameter. 10 mm right obturator node on image 273. Small para-aortic  nodes.  Postoperative changes in the lumbar spine are noted. Severe degenerative disc  disease and endplate changes at L2-3. L4-5 fusion. No vertebral compression deformity.  Review of the MIP images confirms the above findings.  IMPRESSION: CTA CHEST IMPRESSION  No evidence of aortic aneurysm or dissection.  Significant narrowing at the origin of the vertebral artery is suggested.  4 mm right upper lobe pulmonary nodules. If the patient is at high risk for bronchogenic carcinoma, follow-up chest CT at 1year is recommended. If the patient is at low risk, no follow-up is needed. This recommendation follows the consensus statement: Guidelines for Management of Small Pulmonary Nodules Detected on CT Scans: A Statement from the Fleischner Society as published in Radiology 2005; 237:395-400.  Extensive aortic valve calcification and left ventricular hypertrophy consistent with a history of aortic stenosis.  7 mm right thyroid nodule.  CTA ABDOMEN AND PELVIS IMPRESSION  No evidence of aorto occlusive disease or iliac artery narrowing to results and arterial insufficiency.  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  Mild abnormal adenopathy in the small bowel mesentery. The lymphoproliferative process cannot be excluded. PET-CT may be helpful as clinically indicated. At a minimum, 3-6 month followup is recommended to ensure stability.   Electronically Signed   By: Maryclare Bean M.D.   On: 12/08/2012 13:25    EKG: pending  ASSESSMENT AND PLAN:  Principal Problem:   Abnormal stress echocardiogram Active Problems:   Severe aortic stenosis   Bicuspid aortic valve   Aortic regurgitation   Lymphadenopathy, pelvis- noted on CT 12/08/12   Pulmonary nodules- noted on CT 12/08/12   Iliac artery aneurysm, bilateral- on CT 12/08/12   Degenerative disc disease, lumbar- on diasability   Hyperlipidemia   Depression- history of substance abuse- clean X 4 years   Chronic low back pain   Family history of coronary artery disease   PLAN: Rt and  Lt heart cath. Pt is concerned about resistance to sedation. He says he was awake during his TEE. He he may require more sedation in the lab.   Deland Pretty, PA-C 12/10/2012, 10:12 AM Agree with note written by Corine Shelter [facility]  Mod severe AS/AI/MR referred for R/L heart cath prior to potential AVR. Pt has seen Dr. Cornelius Moras as well.   Runell Gess 12/10/2012 10:39 AM

## 2012-12-14 ENCOUNTER — Encounter: Payer: Self-pay | Admitting: Thoracic Surgery (Cardiothoracic Vascular Surgery)

## 2012-12-14 ENCOUNTER — Ambulatory Visit (INDEPENDENT_AMBULATORY_CARE_PROVIDER_SITE_OTHER): Payer: Medicare Other | Admitting: Thoracic Surgery (Cardiothoracic Vascular Surgery)

## 2012-12-14 VITALS — BP 150/93 | HR 67 | Resp 20 | Ht 72.0 in | Wt 188.0 lb

## 2012-12-14 DIAGNOSIS — F17200 Nicotine dependence, unspecified, uncomplicated: Secondary | ICD-10-CM

## 2012-12-14 DIAGNOSIS — I351 Nonrheumatic aortic (valve) insufficiency: Secondary | ICD-10-CM

## 2012-12-14 DIAGNOSIS — J449 Chronic obstructive pulmonary disease, unspecified: Secondary | ICD-10-CM

## 2012-12-14 DIAGNOSIS — R918 Other nonspecific abnormal finding of lung field: Secondary | ICD-10-CM

## 2012-12-14 DIAGNOSIS — Q231 Congenital insufficiency of aortic valve: Secondary | ICD-10-CM

## 2012-12-14 DIAGNOSIS — Z720 Tobacco use: Secondary | ICD-10-CM | POA: Insufficient documentation

## 2012-12-14 DIAGNOSIS — J4489 Other specified chronic obstructive pulmonary disease: Secondary | ICD-10-CM

## 2012-12-14 DIAGNOSIS — I359 Nonrheumatic aortic valve disorder, unspecified: Secondary | ICD-10-CM

## 2012-12-14 DIAGNOSIS — I350 Nonrheumatic aortic (valve) stenosis: Secondary | ICD-10-CM

## 2012-12-14 HISTORY — DX: Tobacco use: Z72.0

## 2012-12-14 HISTORY — DX: Chronic obstructive pulmonary disease, unspecified: J44.9

## 2012-12-14 NOTE — Progress Notes (Signed)
[address]       [place] [address]             [phone]     CARDIOTHORACIC SURGERY OFFICE NOTE  Referring Provider is Sherrill Raring, MD PCP is Caffie Damme, MD   HPI:  Patient returns for followup of bicuspid aortic valve disease with severe aortic stenosis. He was originally seen in consultation on 11/30/2012. Since then he underwent cardiac catheterization by Dr. Allyson Sabal. He was found to have moderate nonobstructive coronary artery disease.  Pulmonary artery pressures were normal. The patient also underwent CT angiogram of the chest abdomen and pelvis. He was found to have several small (4 mm) pulmonary nodules in the right lung.  Finally he underwent pulmonary function tests which document the presence of mild COPD. Patient returns to the office today to discuss the results of these tests and potentially schedule elective aortic valve replacement in the near future. He reports no new problems or complaints since his last office visit. He continues to smoke cigarettes.    Current Outpatient Prescriptions  Medication Sig Dispense Refill  . aspirin EC 81 MG tablet Take 81 mg by mouth daily.      Marland Kitchen gabapentin (NEURONTIN) 800 MG tablet Take 800 mg by mouth 4 (four) times daily.       . rosuvastatin (CRESTOR) 10 MG tablet Take 10 mg by mouth daily.      Marland Kitchen venlafaxine XR (EFFEXOR-XR) 75 MG 24 hr capsule Take 75 mg by mouth daily.       No current facility-administered medications for this visit.      Physical Exam:   BP 150/93  Pulse 67  Resp 20  Ht 6' (1.829 m)  Wt 188 lb (85.276 kg)  BMI 25.49 kg/m2  SpO2 97%  General:  Well-appearing  Chest:   Clear to auscultation with a few scattered rhonchi  CV:   Regular rate and rhythm with prominent systolic murmur  Incisions:  n/a  Abdomen:  Soft and nontender  Extremities:  Warm and well-perfused  Diagnostic Tests:  CARDIAC CATHETERIZATION   History obtained from chart review.52 y/o with a  history of a heart murmur and DOE. He has been followed by Dr Julianne Handler for bicuspid AOV with AS/MR. The pt denies chest pain. He has had DOE. A recent stress echo was abnormal with mod-severe AS and moderate MR and decreased exercise tolerance. TEE was performed after this. He has been referred for AVR and was seen by Dr Cornelius Moras 11/30/12. He is admitted now for Rt and Lt heart cath.  He denies any history of angina or syncope. He says he had a negative Myoview a year or so ago. He has a strong family history of CAD- his father and sister died of an MI in their 80s. He smokes a pack a day. He is disabled secondary to chronic back pain. 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 CT done 10/7 showed pulmonary nodules, pelvic lymphadenopathy, and bilateral iliac aneurysms  PROCEDURE DESCRIPTION:  The patient was brought to the second floor [facility] cath lab in the postabsorptive state. He was premedicated with Valium 5 mg by mouth, IV Versed and fentanyl. His left groinwas prepped and shaved in usual sterile fashion. Xylocaine 1% was used for local anesthesia. A 5 French sheath was inserted into the left common femoral artery using standard Seldinger technique. A 7  French sheath was inserted into the left common femoral vein using standard Seldinger technique. A 7 French balloon tip thermal dilution Swan-Ganz catheter was then advanced to the right heart chambers obtaining sequential pressures, blood samples and thermal dilution cardiac output was obtained. A total of 80 cc of contrast was administered to the patient  HEMODYNAMICS:  AO SYSTOLIC/AO DIASTOLIC: 131/76  RA pressure: 13/11, mean = 9  RV pressure: 37/10  Pulmonary artery pressure: 34/0, mean equals 23  Pulmonary capillary wedge pressure:24/22, mean equals 21  Cardiac output (Fick) equals 3.32 L per minute, 1.6 L per minute per meter squared  Cardiac output (demolition) 2.97 L per minute,  1.43 L per minute per meter squared  ANGIOGRAPHIC RESULTS:  1. Left main; normal  2. LAD; the LAD gave off a small first diagonal branch and a small to medium size second I will branch which had a 50-60% proximal stenosis  3. Left circumflex; codominant and free of significant disease. There was a small ramus branch that had diffuse moderate segmental proximal disease.  4. Right coronary artery; codominant with some aneurysmal dilatation in its proximal to midportion but no obstructive disease  5. Left ventriculography;not performed today  6: Abdominal aortogram-the distal abdominal aorta was widely patent. The iliac bifurcation, common and external iliac arteries were free of significant atherosclerotic or aneurysmal disease.  IMPRESSION:Mr. Pancake has noncritical CAD, moderate to severe AS/AI. Abdomen aortogram showed widely patent iliacs suitable for TAVR if necessary. The sheath were removed and pressure was held on the groin to achieve hemostasis. The patient left the lab in stable condition. He will be discharged home after remaining recumbent for 6 hours. Dsr. Ray Hanley Hays and Ashley Mariner were notified of these results.  Runell Gess MD, [facility]  12/10/2012  11:31 AM    CT ANGIOGRAPHY CHEST, ABDOMEN AND PELVIS  TECHNIQUE:  Multidetector CT imaging through the chest, abdomen and pelvis was  performed using the standard protocol during bolus administration of  intravenous contrast. Multiplanar reconstructed images including  MIPs were obtained and reviewed to evaluate the vascular anatomy.  CONTRAST: OMNIPAQUE IOHEXOL 350 MG/ML SOLN  COMPARISON: None.  FINDINGS:  CTA CHEST FINDINGS  Pre contrast images demonstrate no evidence of intramural hematoma.  There is pronounced calcification involving all leaflets of the  aortic valve. Mild 3 vessel coronary artery calcifications. Minimal  mitral annular calcification.  Aortic diameters at the sinus of Valsalva, sino-tubular junction,    and ascending aorta are 3.5 cm, 2.7 cm, and 3.7 cm respectively. No  evidence of aneurysm or dissection.  Innominate artery, right subclavian artery, right common carotid  artery, left common carotid artery, left subclavian artery are  widely patent. Significant narrowing at the origin of the left  vertebral artery is suggested. The right vertebral artery is patent.  No obvious filling defects in the pulmonary arterial tree to suggest  acute pulmonary thromboembolism.  Enlargement of the left ventricle with left ventricular muscular  hypertrophy is noted.  No pericardial effusion. No abnormal adenopathy by measurement  criteria.  No pneumothorax. No pleural effusion.  3 mm right upper lobe pulmonary nodule on image 57. 4 mm right upper  lobe nodule on image 67. 5 mm visceral pleural node on the left on  image 60.  No acute bony deformity.  Heterogeneous 7 mm right thyroid nodule on image 2.  Review of the MIP images confirms the above findings.  CTA ABDOMEN AND PELVIS FINDINGS  The aorta is non aneurysmal and patent. Little  if any plaque in the  suprarenal are juxtarenal aorta. There is irregular circumferential  plaque in the infrarenal aorta without significant narrowing.  Celiac axis is patent. Branch vessels are patent.  SMA is patent. Branch vessels are patent.  A single right renal artery and 2 left renal arteries are patent.  There is cortical narrowing at the origin of the IMA. Branch vessels  are patent.  Atherosclerotic changes of the right common iliac artery without  significant narrowing. It is mildly ectatic at 10 mm. Right internal  and external iliac arteries are patent. Right external iliac artery  is ectatic at 10 mm.  There is irregular plaque within the left common iliac artery  without significant narrowing. It is aneurysmal measuring 17 mm in  caliber. Left external iliac artery is patent with some plaque at  its origin. There is significant narrowing at the  origin of the left  internal iliac artery with post stenotic dilatation measuring up to  10 mm.  Proximal femoral vasculature is patent.  The liver, gallbladder, spleen, adrenal glands, kidneys are within  normal limits.  Normal appendix. Minimal diverticulosis of the sigmoid colon without  acute diverticulitis  Mild bladder wall thickening is nonspecific. Unremarkable prostate.  Mild mesenteric adenopathy is present. Several small bowel  mesenteric lymph nodes are visualized. The largest is 13 mm in short  axis diameter. 10 mm right obturator node on image 273. Small  para-aortic nodes.  Postoperative changes in the lumbar spine are noted. Severe  degenerative disc disease and endplate changes at L2-3. L4-5 fusion.  No vertebral compression deformity.  Review of the MIP images confirms the above findings.  IMPRESSION:  CTA CHEST IMPRESSION  No evidence of aortic aneurysm or dissection.  Significant narrowing at the origin of the vertebral artery is  suggested.  4 mm right upper lobe pulmonary nodules. If the patient is at high  risk for bronchogenic carcinoma, follow-up chest CT at 1year is  recommended. If the patient is at low risk, no follow-up is needed.  This recommendation follows the consensus statement: Guidelines for  Management of Small Pulmonary Nodules Detected on CT Scans: A  Statement from the Fleischner Society as published in Radiology  2005; 237:395-400.  Extensive aortic valve calcification and left ventricular  hypertrophy consistent with a history of aortic stenosis.  7 mm right thyroid nodule.  CTA ABDOMEN AND PELVIS IMPRESSION  No evidence of aorto occlusive disease or iliac artery narrowing to  results and arterial insufficiency.  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  Mild abnormal adenopathy in the small bowel mesentery. The  lymphoproliferative process cannot be excluded. PET-CT  may be  helpful as clinically indicated. At a minimum, 3-6 month followup is  recommended to ensure stability.  Electronically Signed  By: Maryclare Bean M.D.  On: 12/08/2012 13:25        Pulmonary Function Tests  Baseline      Post-bronchodilator  FVC  3.86 L  (75% predicted) FVC  4.09 L  (80% predicted) FEV1  2.79 L  (71% predicted) FEV1  3.15 L  (80% predicted) FEF25-75 1.8 L  (54% predicted) FEF25-75 2.99 L  (89% predicted)  RV  2.51 L  (115% predicted) DLCO  51% predicted      Impression:  Patient has congenitally bicuspid aortic valve with severe symptomatic aortic stenosis and at least moderate aortic insufficiency. Left ventricular systolic function appears preserved. Cardiac catheterization  is notable for the presence of moderate nonobstructive coronary artery disease with normal right-sided pressures. Pulmonary function testing documents the presence of mild COPD. CT angiogram of the chest abdomen and pelvis is notable for the absence of aneurysmal enlargement of the ascending thoracic aorta as well as no significant aortoiliac occlusive disease which might preclude the use of femoral artery cannulation for surgery. The patient does have a few small pulmonary nodules seen on CT scan which although quite small probably mandate followup CT scan in one year because of the patient's long-standing history of tobacco abuse.    Plan:  Patient was again counseled at length regarding the indications number risks, and potential benefits of elective aortic valve replacement.   Discussion was held comparing the relative risks of mechanical valve replacement with need for lifelong anticoagulation versus use of a bioprosthetic tissue valve and the associated potential for late structural valve deterioration in failure.  This discussion was placed in the context of the patient's particular circumstances, and as a result the patient specifically requests that their valve be replaced using a  bioprosthetic tissue valve.  Alternative surgical approaches have been discussed, including a comparison between conventional sternotomy and minimally-invasive techniques.  The relative risks and benefits of each have been reviewed as they pertain to the patient's specific circumstances.  He understands and accepts all potential associated risks of surgery including but not limited to risk of death, stroke, myocardial infarction, congestive heart failure, respiratory failure, renal failure, pneumonia, bleeding requiring blood transfusion and or reexploration, arrhythmia, heart block or bradycardia requiring permanent pacemaker, pleural effusions or other delayed complications related to continued congestive heart failure, or other late complications related to valve replacement including the development of paravalvular leak, endocarditis, or late structural valve deterioration and failure.  We plan to proceed with surgery on Thursday, October 23 using a bioprosthetic tissue valve to replace his diseased aortic valve via right miniature anterior thoracotomy approach.     [name]. Cornelius Moras, MD 12/14/2012 1:10 PM

## 2012-12-14 NOTE — Patient Instructions (Signed)
The patient should make every effort to stop smoking immediately and permanently.  

## 2012-12-15 ENCOUNTER — Other Ambulatory Visit: Payer: Self-pay

## 2012-12-15 DIAGNOSIS — I359 Nonrheumatic aortic valve disorder, unspecified: Secondary | ICD-10-CM

## 2012-12-15 NOTE — H&P (Signed)
[address]       [place] [address]             [phone]          CARDIOTHORACIC SURGERY HISTORY AND PHYSICAL EXAM  Referring Provider is Sherrill Raring, MD PCP is Caffie Damme, MD    Chief Complaint   Patient presents with   .  Aortic Stenosis       Surgical eval on severe AS,TEE 11/24/12, 2D Echo 11/19/12     HPI:  Patient is a 52 year old disabled white male from [facility] with bicuspid aortic valve disease including aortic stenosis and aortic insufficiency who has been referred for possible elective surgical intervention.  The patient states that he was first noted to have a heart murmur on physical exam performed by his neurosurgeon in 1999. He has been followed by Dr. Hanley Hays for the last several years with serial echocardiograms which have revealed normal LV systolic function and severe aortic stenosis. Initially the patient remained asymptomatic, although recently he has noticed some changes in his exercise tolerance with mild exertional shortness of breath.  He underwent a stress echocardiogram 11/19/2012. Patient had poor exercise tolerance stopping only 4 1/2 minutes into a standard Bruce protocol due to shortness of breath.  Peak exercise echo findings demonstrated that the patient's transvalvular gradient across the aortic valve reportedly increased to greater than 90 mm mercury post exercise up from baseline 60-70 mm mercury.  The patient subsequently underwent transesophageal echocardiogram confirming the presence of bicuspid aortic valve with moderate to severe aortic stenosis and moderate to severe aortic regurgitation. Left ventricular size and systolic function appeared normal with ejection fraction estimated 60-65%. Patient was referred for elective surgical consultation.  He was originally seen in consultation on 11/30/2012. Since then he underwent cardiac catheterization by Dr. Allyson Sabal. He was found to have moderate nonobstructive coronary  artery disease.  Pulmonary artery pressures were normal. The patient also underwent CT angiogram of the chest abdomen and pelvis. He was found to have several small (4 mm) pulmonary nodules in the right lung.  Finally he underwent pulmonary function tests which document the presence of mild COPD. Patient returns to the office today to discuss the results of these tests and potentially schedule elective aortic valve replacement in the near future.   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 He is somewhat limited by chronic pain but overall he states that he gets around the quite well.  Over the last couple of years he has appreciated significant worsening of exertional shortness of breath. He only gets short of breath with strenuous activity such as mowing the lawn. He does not get short of breath with normal activity. He has not had any chest pain or chest tightness either with activity or at rest. He denies any PND, orthopnea, or lower extremity edema. He has not had any dizzy spells or syncope.  He reports no new problems or complaints since his initial office consultation. He continues to smoke cigarettes.    Past Medical History  Diagnosis Date  . Depression   . Aortic stenosis   . DDD (degenerative disc disease), cervical   . Hypercholesteremia   . Heart murmur   . Bicuspid aortic valve   . Aortic insufficiency   . Alcohol abuse   . Vitamin D deficiency   . C. difficile colitis   .  Overweight   . Thyroid nodule   . Former smoker   . Carotid stenosis     MILD  . Pilonidal cyst 04/30/2012  . Severe aortic stenosis 11/30/2012  . Degenerative disc disease 11/30/2012  . Hyperlipidemia 11/30/2012  . Chronic low back pain 11/30/2012  . Degenerative disc disease, lumbar 11/30/2012  . Lung nodule, multiple 12/08/2012    Multiple small (4mm) nodules right lung noted on CT scan  . Tobacco  abuse 12/14/2012  . COPD (chronic obstructive pulmonary disease) 12/14/2012    Mild COPD with FEV1 71% predicted    Past Surgical History  Procedure Laterality Date  . Back surgery  1999/2003    Family History  Problem Relation Age of Onset  . Hyperlipidemia Father   . Heart disease Father   . Hyperlipidemia Mother   . Heart disease Mother   . Colon cancer Brother   . Heart disease Sister   . Cancer Brother     Colon  . Cancer Brother     Colon    Social History History  Substance Use Topics  . Smoking status: Current Every Day Smoker -- 1.00 packs/day for 25 years    Types: Cigarettes    Start date: 12/01/1987  . Smokeless tobacco: Never Used  . Alcohol Use: No    Prior to Admission medications   Medication Sig Start Date End Date Taking? Authorizing Provider  aspirin EC 81 MG tablet Take 81 mg by mouth daily.    Historical Provider, MD  gabapentin (NEURONTIN) 800 MG tablet Take 800 mg by mouth 4 (four) times daily.     Historical Provider, MD  rosuvastatin (CRESTOR) 10 MG tablet Take 10 mg by mouth daily.    Historical Provider, MD  venlafaxine XR (EFFEXOR-XR) 75 MG 24 hr capsule Take 75 mg by mouth daily.    Historical Provider, MD    Allergies  Allergen Reactions  . Morphine And Related     Patient states he wants nothing NARCOTIC r/t past abuse to drugs/alcohol.     Review of Systems:              General:                      normal appetite, normal energy, no weight gain, + weight loss, no fever             Cardiac:                      no chest pain with exertion, no chest pain at rest, + SOB with strenuous exertion, no resting SOB, no PND, no orthopnea, no palpitations, no arrhythmia, no atrial fibrillation, no LE edema, no dizzy spells, no syncope             Respiratory:                + exertional shortness of breath, no home oxygen, no productive cough, + chronic dry cough, no bronchitis, no wheezing, no hemoptysis, no asthma, no pain with inspiration  or cough, no sleep apnea, no no CPAP at night             GI:                                no difficulty swallowing, no reflux, no frequent heartburn, no hiatal hernia, no abdominal pain, no constipation, no diarrhea,  no hematochezia, no hematemesis, no melena             GU:                              no dysuria,  no frequency, no urinary tract infection, no hematuria, no enlarged prostate, no kidney stones, no kidney disease             Vascular:                     no pain suggestive of claudication, no pain in feet, + leg cramps, no varicose veins, no DVT, no non-healing foot ulcer             Neuro:                         no stroke, no TIA's, no seizures, no headaches, no temporary blindness one eye,  no slurred speech, no peripheral neuropathy, + chronic pain, no instability of gait, no memory/cognitive dysfunction             Musculoskeletal:         + arthritis in back and neck, no joint swelling, no myalgias, no difficulty walking, normal mobility               Skin:                            no rash, no itching, no skin infections, no pressure sores or ulcerations             Psych:                         no anxiety, + depression, no nervousness, no unusual recent stress             Eyes:                           no blurry vision, no floaters, no recent vision changes, does not wear glasses or contacts             ENT:                            no hearing loss, no loose or painful teeth, no dentures, last saw dentist 2011             Hematologic:               no easy bruising, no abnormal bleeding, no clotting disorder, no frequent epistaxis             Endocrine:                   no diabetes, does not check CBG's at home                           Physical Exam:              BP 146/86  Pulse 67  Resp 16  Ht 6' (1.829 m)  Wt 185 lb (83.915 kg)  BMI 25.08 kg/m2  SpO2 98%             General:  well-appearing             HEENT:                        Unremarkable               Neck:                           no JVD, no bruits, no adenopathy               Chest:                         clear to auscultation, symmetrical breath sounds, no wheezes, no rhonchi               CV:                              RRR, grade III/VI crescendo/decrescendo systolic murmur and early diastolic murmur             Abdomen:                    soft, non-tender, no masses               Extremities:                 warm, well-perfused, pulses diminished but palpable, no LE edema             Rectal/GU                   Deferred             Neuro:                         Grossly non-focal and symmetrical throughout             Skin:                            Clean and dry, no rashes, no breakdown   Diagnostic Tests:  TRANSESOPHAGEAL ECHOCARDIOGRAM  Both the images and the report from transesophageal echocardiogram performed 11/24/2012 are reviewed. The patient has congenitally bicuspid aortic valve with moderate to severe aortic stenosis and at least moderate aortic insufficiency. Left ventricular size and systolic function appear normal.  Ejection fraction was estimated 60-65%. Peak velocity across the aortic valve was measured 3.8 m/s.The peak and mean transvalvular gradients were estimated to be 59 and 31 mm mercury respectively. Valve area by planimetry was approximately 1.0 cm.  The valve leaflets were relatively thin but demonstrated decreased leaflet mobility and fusion of the left and right cusps of the valve. The aortic root appeared somewhat enlarged.    STRESS ECHOCARDIOGRAM  The patient reportedly exercised for 4 minutes 30 seconds into a standard Bruce protocol at which time the patient achieved a maximum heart rate of 125 beats per minute. The test was stopped because of severe shortness of breath. The patient's baseline transvalvular gradient was reportedly 60-70 mm mercury, and this increased to greater than 90 mm mercury post exercise. Left  ventricular systolic function appeared normal with ejection fraction 60%. No wall motion abnormalities were seen with exercise. There were no ST T wave changes seen with exercise by ECG.  CARDIAC CATHETERIZATION   History obtained from chart review.52 y/o with a history of a heart murmur and DOE. He has been followed by Dr Julianne Handler for bicuspid AOV with AS/MR. The pt denies chest pain. He has had DOE. A recent stress echo was abnormal with mod-severe AS and moderate MR and decreased exercise tolerance. TEE was performed after this. He has been referred for AVR and was seen by Dr Cornelius Moras 11/30/12. He is admitted now for Rt and Lt heart cath.   He denies any history of angina or syncope. He says he had a negative Myoview a year or so ago. He has a strong family history of CAD- his father and sister died of an MI in their 48s. He smokes a pack a day. He is disabled secondary to chronic back pain. 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 CT done 10/7 showed pulmonary nodules, pelvic lymphadenopathy, and bilateral iliac aneurysms   PROCEDURE DESCRIPTION:   The patient was brought to the second floor [facility] cath lab in the postabsorptive state. He was premedicated with Valium 5 mg by mouth, IV Versed and fentanyl. His left groinwas prepped and shaved in usual sterile fashion. Xylocaine 1% was used for local anesthesia. A 5 French sheath was inserted into the left common femoral artery using standard Seldinger technique. A 7 French sheath was inserted into the left common femoral vein using standard Seldinger technique. A 7 French balloon tip thermal dilution Swan-Ganz catheter was then advanced to the right heart chambers obtaining sequential pressures, blood samples and thermal dilution cardiac output was obtained. A total of 80 cc of contrast was administered to the patient   HEMODYNAMICS:  AO SYSTOLIC/AO DIASTOLIC: 131/76   RA pressure: 13/11,  mean = 9   RV pressure: 37/10   Pulmonary artery pressure: 34/0, mean equals 23   Pulmonary capillary wedge pressure:24/22, mean equals 21   Cardiac output (Fick) equals 3.32 L per minute, 1.6 L per minute per meter squared   Cardiac output (demolition) 2.97 L per minute, 1.43 L per minute per meter squared   ANGIOGRAPHIC RESULTS:  1. Left main; normal   2. LAD; the LAD gave off a small first diagonal branch and a small to medium size second I will branch which had a 50-60% proximal stenosis   3. Left circumflex; codominant and free of significant disease. There was a small ramus branch that had diffuse moderate segmental proximal disease.   4. Right coronary artery; codominant with some aneurysmal dilatation in its proximal to midportion but no obstructive disease   5. Left ventriculography;not performed today   6: Abdominal aortogram-the distal abdominal aorta was widely patent. The iliac bifurcation, common and external iliac arteries were free of significant atherosclerotic or aneurysmal disease.   IMPRESSION:Mr. Mohler has noncritical CAD, moderate to severe AS/AI. Abdomen aortogram showed widely patent iliacs suitable for TAVR if necessary. The sheath were removed and pressure was held on the groin to achieve hemostasis. The patient left the lab in stable condition. He will be discharged home after remaining recumbent for 6 hours. Dsr. Ray Hanley Hays and Ashley Mariner were notified of these results.   Runell Gess MD, [facility]   12/10/2012   11:31 AM    CT ANGIOGRAPHY CHEST, ABDOMEN AND PELVIS  TECHNIQUE:   Multidetector CT imaging through the chest, abdomen and pelvis was   performed using the standard protocol during bolus administration of  intravenous contrast. Multiplanar reconstructed images including   MIPs were obtained and reviewed to evaluate the vascular anatomy.   CONTRAST: OMNIPAQUE IOHEXOL 350 MG/ML SOLN   COMPARISON: None.   FINDINGS:   CTA CHEST FINDINGS   Pre  contrast images demonstrate no evidence of intramural hematoma.   There is pronounced calcification involving all leaflets of the   aortic valve. Mild 3 vessel coronary artery calcifications. Minimal   mitral annular calcification.   Aortic diameters at the sinus of Valsalva, sino-tubular junction,   and ascending aorta are 3.5 cm, 2.7 cm, and 3.7 cm respectively. No   evidence of aneurysm or dissection.   Innominate artery, right subclavian artery, right common carotid   artery, left common carotid artery, left subclavian artery are   widely patent. Significant narrowing at the origin of the left   vertebral artery is suggested. The right vertebral artery is patent.   No obvious filling defects in the pulmonary arterial tree to suggest   acute pulmonary thromboembolism.   Enlargement of the left ventricle with left ventricular muscular   hypertrophy is noted.   No pericardial effusion. No abnormal adenopathy by measurement   criteria.   No pneumothorax. No pleural effusion.   3 mm right upper lobe pulmonary nodule on image 57. 4 mm right upper   lobe nodule on image 67. 5 mm visceral pleural node on the left on   image 60.   No acute bony deformity.   Heterogeneous 7 mm right thyroid nodule on image 2.   Review of the MIP images confirms the above findings.   CTA ABDOMEN AND PELVIS FINDINGS   The aorta is non aneurysmal and patent. Little if any plaque in the   suprarenal are juxtarenal aorta. There is irregular circumferential   plaque in the infrarenal aorta without significant narrowing.   Celiac axis is patent. Branch vessels are patent.   SMA is patent. Branch vessels are patent.   A single right renal artery and 2 left renal arteries are patent.   There is cortical narrowing at the origin of the IMA. Branch vessels   are patent.   Atherosclerotic changes of the right common iliac artery without   significant narrowing. It is mildly ectatic at 10 mm. Right internal   and  external iliac arteries are patent. Right external iliac artery   is ectatic at 10 mm.   There is irregular plaque within the left common iliac artery   without significant narrowing. It is aneurysmal measuring 17 mm in   caliber. Left external iliac artery is patent with some plaque at   its origin. There is significant narrowing at the origin of the left   internal iliac artery with post stenotic dilatation measuring up to   10 mm.   Proximal femoral vasculature is patent.   The liver, gallbladder, spleen, adrenal glands, kidneys are within   normal limits.   Normal appendix. Minimal diverticulosis of the sigmoid colon without   acute diverticulitis   Mild bladder wall thickening is nonspecific. Unremarkable prostate.   Mild mesenteric adenopathy is present. Several small bowel   mesenteric lymph nodes are visualized. The largest is 13 mm in short   axis diameter. 10 mm right obturator node on image 273. Small   para-aortic nodes.   Postoperative changes in the lumbar spine are noted. Severe   degenerative disc disease and endplate changes at L2-3. L4-5 fusion.   No vertebral compression deformity.   Review of the  MIP images confirms the above findings.   IMPRESSION:   CTA CHEST IMPRESSION   No evidence of aortic aneurysm or dissection.   Significant narrowing at the origin of the vertebral artery is   suggested.   4 mm right upper lobe pulmonary nodules. If the patient is at high   risk for bronchogenic carcinoma, follow-up chest CT at 1year is   recommended. If the patient is at low risk, no follow-up is needed.   This recommendation follows the consensus statement: Guidelines for   Management of Small Pulmonary Nodules Detected on CT Scans: A   Statement from the Fleischner Society as published in Radiology   2005; 237:395-400.   Extensive aortic valve calcification and left ventricular   hypertrophy consistent with a history of aortic stenosis.   7 mm right thyroid nodule.     CTA ABDOMEN AND PELVIS IMPRESSION   No evidence of aorto occlusive disease or iliac artery narrowing to   results and arterial insufficiency.   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   Mild abnormal adenopathy in the small bowel mesentery. The   lymphoproliferative process cannot be excluded. PET-CT may be   helpful as clinically indicated. At a minimum, 3-6 month followup is   recommended to ensure stability.   Electronically Signed   By: Maryclare Bean M.D.   On: 12/08/2012 13:25            Pulmonary Function Tests  Baseline                                                                      Post-bronchodilator  FVC                 3.86 L  (75% predicted)          FVC                 4.09 L  (80% predicted) FEV1               2.79 L  (71% predicted)          FEV1               3.15 L  (80% predicted) FEF25-75        1.8 L  (54% predicted)            FEF25-75        2.99 L  (89% predicted)  RV                   2.51 L  (115% predicted) DLCO              51% predicted      Impression:  Patient has congenitally bicuspid aortic valve with severe symptomatic aortic stenosis and at least moderate aortic insufficiency. Left ventricular systolic function appears preserved. Cardiac catheterization is notable for the presence of moderate nonobstructive coronary artery disease with normal right-sided pressures. Pulmonary function testing documents the presence of mild COPD. CT angiogram of the chest abdomen and pelvis is notable for the absence of aneurysmal enlargement of the ascending thoracic aorta as well as no significant aortoiliac occlusive disease which  might preclude the use of femoral artery cannulation for surgery. The patient does have a few small pulmonary nodules seen on CT scan which although quite small probably mandate followup CT scan in one year because of the patient's long-standing history of tobacco  abuse.    Plan:  Patient was again counseled at length regarding the indications number risks, and potential benefits of elective aortic valve replacement.   Discussion was held comparing the relative risks of mechanical valve replacement with need for lifelong anticoagulation versus use of a bioprosthetic tissue valve and the associated potential for late structural valve deterioration in failure.  This discussion was placed in the context of the patient's particular circumstances, and as a result the patient specifically requests that their valve be replaced using a bioprosthetic tissue valve.  Alternative surgical approaches have been discussed, including a comparison between conventional sternotomy and minimally-invasive techniques.  The relative risks and benefits of each have been reviewed as they pertain to the patient's specific circumstances.  He understands and accepts all potential associated risks of surgery including but not limited to risk of death, stroke, myocardial infarction, congestive heart failure, respiratory failure, renal failure, pneumonia, bleeding requiring blood transfusion and or reexploration, arrhythmia, heart block or bradycardia requiring permanent pacemaker, pleural effusions or other delayed complications related to continued congestive heart failure, or other late complications related to valve replacement including the development of paravalvular leak, endocarditis, or late structural valve deterioration and failure.  We plan to proceed with surgery on Thursday, October 23 using a bioprosthetic tissue valve to replace his diseased aortic valve via right miniature anterior thoracotomy approach.  The patient understands there is a possibility that for a variety of technical reasons there is a small chance that his surgery will need to be converted to full conventional median sternotomy or transverse sternotomy.  He has again been strongly encouraged to quit  smoking.     [name]. Cornelius Moras, MD 12/14/2012 1:10 PM

## 2012-12-18 ENCOUNTER — Encounter (HOSPITAL_COMMUNITY): Payer: Self-pay | Admitting: Pharmacy Technician

## 2012-12-22 ENCOUNTER — Ambulatory Visit (HOSPITAL_COMMUNITY)
Admission: RE | Admit: 2012-12-22 | Discharge: 2012-12-22 | Disposition: A | Discharge status: Home / Self Care | Payer: Medicare Other | Source: Ambulatory Visit | Attending: Thoracic Surgery (Cardiothoracic Vascular Surgery) | Admitting: Thoracic Surgery (Cardiothoracic Vascular Surgery)

## 2012-12-22 ENCOUNTER — Encounter (HOSPITAL_COMMUNITY): Payer: Self-pay

## 2012-12-22 ENCOUNTER — Encounter (HOSPITAL_COMMUNITY)
Admission: RE | Admit: 2012-12-22 | Discharge: 2012-12-22 | Disposition: A | Discharge status: Home / Self Care | Payer: Medicare Other | Source: Ambulatory Visit | Attending: Thoracic Surgery (Cardiothoracic Vascular Surgery) | Admitting: Thoracic Surgery (Cardiothoracic Vascular Surgery)

## 2012-12-22 VITALS — BP 124/79 | HR 73 | Temp 97.8°F | Resp 20 | Ht 72.0 in | Wt 186.6 lb

## 2012-12-22 DIAGNOSIS — G5630 Lesion of radial nerve, unspecified upper limb: Secondary | ICD-10-CM | POA: Insufficient documentation

## 2012-12-22 DIAGNOSIS — Z01810 Encounter for preprocedural cardiovascular examination: Secondary | ICD-10-CM

## 2012-12-22 DIAGNOSIS — I359 Nonrheumatic aortic valve disorder, unspecified: Secondary | ICD-10-CM

## 2012-12-22 DIAGNOSIS — G5620 Lesion of ulnar nerve, unspecified upper limb: Secondary | ICD-10-CM | POA: Insufficient documentation

## 2012-12-22 DIAGNOSIS — I6529 Occlusion and stenosis of unspecified carotid artery: Secondary | ICD-10-CM | POA: Insufficient documentation

## 2012-12-22 DIAGNOSIS — Z01812 Encounter for preprocedural laboratory examination: Secondary | ICD-10-CM | POA: Insufficient documentation

## 2012-12-22 DIAGNOSIS — I658 Occlusion and stenosis of other precerebral arteries: Secondary | ICD-10-CM | POA: Insufficient documentation

## 2012-12-22 DIAGNOSIS — I517 Cardiomegaly: Secondary | ICD-10-CM | POA: Insufficient documentation

## 2012-12-22 LAB — COMPREHENSIVE METABOLIC PANEL
ALT: 26 U/L (ref 0–53)
BUN: 8 mg/dL (ref 6–23)
CO2: 21 mEq/L (ref 19–32)
Calcium: 9.3 mg/dL (ref 8.4–10.5)
Chloride: 103 mEq/L (ref 96–112)
Creatinine, Ser: 1.08 mg/dL (ref 0.50–1.35)
GFR calc Af Amer: 88 mL/min — ABNORMAL LOW (ref >90)
GFR calc non Af Amer: 76 mL/min — ABNORMAL LOW (ref >90)
Glucose, Bld: 97 mg/dL (ref 70–99)
Sodium: 137 mEq/L (ref 135–145)

## 2012-12-22 LAB — BLOOD GAS, ARTERIAL
Acid-Base Excess: 0.9 mmol/L (ref 0.0–2.0)
Bicarbonate: 24.6 mEq/L — ABNORMAL HIGH (ref 20.0–24.0)
Drawn by: 344381
O2 Saturation: 97.4 %
Patient temperature: 98.6
pH, Arterial: 7.444 (ref 7.350–7.450)
pO2, Arterial: 81.8 mmHg (ref 80.0–100.0)

## 2012-12-22 LAB — CBC
HCT: 46.7 % (ref 39.0–52.0)
Hemoglobin: 17.2 g/dL — ABNORMAL HIGH (ref 13.0–17.0)
MCH: 33.3 pg (ref 26.0–34.0)
MCHC: 36.8 g/dL — ABNORMAL HIGH (ref 30.0–36.0)
MCV: 90.5 fL (ref 78.0–100.0)
RBC: 5.16 MIL/uL (ref 4.22–5.81)

## 2012-12-22 LAB — TYPE AND SCREEN

## 2012-12-22 LAB — URINALYSIS, ROUTINE W REFLEX MICROSCOPIC
Bilirubin Urine: NEGATIVE
Nitrite: NEGATIVE
Specific Gravity, Urine: 1.013 (ref 1.005–1.030)
pH: 7 (ref 5.0–8.0)

## 2012-12-22 LAB — PROTIME-INR: Prothrombin Time: 12.3 seconds (ref 11.6–15.2)

## 2012-12-22 LAB — SURGICAL PCR SCREEN
MRSA, PCR: NEGATIVE
Staphylococcus aureus: NEGATIVE

## 2012-12-22 LAB — ABO/RH: ABO/RH(D): B POS

## 2012-12-22 LAB — HEMOGLOBIN A1C: Mean Plasma Glucose: 120 mg/dL — ABNORMAL HIGH (ref <117)

## 2012-12-22 NOTE — Pre-Procedure Instructions (Signed)
[name] [name]  12/22/2012   Your procedure is scheduled on:  Thursday October 23,2014 at 0730 AM  Report to [facility] Main Entrance "A" at 0530 AM.  Call this number if you have problems the morning of surgery: [phone]   Remember:   Do not eat food or drink liquids after midnight Wednesday   Take these medicines the morning of surgery with A SIP OF WATER: Gabapentin and Effexor  Stop Plavix and NSAIDS 7 days prior to surgery.   Do not wear jewelry.  Do not wear lotions, powders, or perfumes. You may wear deodorant.   Men may shave face and neck.  Do not bring valuables to the hospital.  [facility] is not responsible for any belongings or valuables.               Contacts, dentures or bridgework may not be worn into surgery.  Leave suitcase in the car. After surgery it may be brought to your room.  For patients admitted to the hospital, discharge time is determined by your treatment team.               Patients discharged the day of surgery will not be allowed to drive home.    Special Instructions: Incentive Spirometry - Practice and bring it with you on the day of surgery. Shower using CHG 2 nights before surgery and the night before surgery.  If you shower the day of surgery use CHG.  Use special wash - you have one bottle of CHG for all showers.  You should use approximately 1/3 of the bottle for each shower.   Please read over the following fact sheets that you were given: Pain Booklet, Coughing and Deep Breathing, Blood Transfusion Information, MRSA Information and Surgical Site Infection Prevention

## 2012-12-22 NOTE — Progress Notes (Addendum)
VASCULAR LAB PRELIMINARY  PRELIMINARY  PRELIMINARY  PRELIMINARY  Pre-op Cardiac Surgery  Carotid Findings:  Bilateral - 1% to 39% lowest end of scale. Bilateral vertebral artery flow is antegrade.  Upper Extremity Right Left  Brachial Pressures 145 Triphasic 143 Triphasic  Radial Waveforms Triphasic Triphasic  Ulnar Waveforms Triphasic Triphasic  Palmar Arch (Allen's Test) Normal Normal   Findings:  Doppler waveforms remained normal bilaterally with both radial and ulnar compression    Vada Swift, RVS 12/22/2012, 12:43 PM

## 2012-12-23 MED ORDER — METOPROLOL TARTRATE 12.5 MG HALF TABLET
12.5000 mg | ORAL_TABLET | Freq: Once | ORAL | Status: AC
  Administered 2012-12-24: 12.5 mg via ORAL
  Filled 2012-12-23: qty 1

## 2012-12-23 MED ORDER — MAGNESIUM SULFATE 50 % IJ SOLN
40.0000 meq | INTRAMUSCULAR | Status: DC
  Filled 2012-12-23: qty 10

## 2012-12-23 MED ORDER — DEXTROSE 5 % IV SOLN
750.0000 mg | INTRAVENOUS | Status: DC
  Filled 2012-12-23: qty 750

## 2012-12-23 MED ORDER — DEXMEDETOMIDINE HCL IN NACL 400 MCG/100ML IV SOLN
0.1000 ug/kg/h | INTRAVENOUS | Status: AC
  Administered 2012-12-24: 0.3 ug/kg/h via INTRAVENOUS
  Filled 2012-12-23: qty 100

## 2012-12-23 MED ORDER — AMINOCAPROIC ACID 250 MG/ML IV SOLN
INTRAVENOUS | Status: AC
Start: 2012-12-24 — End: 2012-12-24
  Administered 2012-12-24: 69.8 mL/h via INTRAVENOUS
  Filled 2012-12-23: qty 40

## 2012-12-23 MED ORDER — NITROGLYCERIN IN D5W 200-5 MCG/ML-% IV SOLN
2.0000 ug/min | INTRAVENOUS | Status: AC
  Administered 2012-12-24: 5 ug/min via INTRAVENOUS
  Filled 2012-12-23: qty 250

## 2012-12-23 MED ORDER — EPINEPHRINE HCL 1 MG/ML IJ SOLN
0.5000 ug/min | INTRAVENOUS | Status: DC
  Filled 2012-12-23: qty 4

## 2012-12-23 MED ORDER — PLASMA-LYTE 148 IV SOLN
INTRAVENOUS | Status: DC
  Filled 2012-12-23: qty 2.5

## 2012-12-23 MED ORDER — PHENYLEPHRINE HCL 10 MG/ML IJ SOLN
30.0000 ug/min | INTRAVENOUS | Status: AC
  Administered 2012-12-24: 25 ug/min via INTRAVENOUS
  Filled 2012-12-23: qty 2

## 2012-12-23 MED ORDER — VANCOMYCIN HCL 1000 MG IV SOLR
INTRAVENOUS | Status: AC
  Administered 2012-12-24: 10:00:00
  Filled 2012-12-23: qty 1000

## 2012-12-23 MED ORDER — VANCOMYCIN HCL 10 G IV SOLR
1500.0000 mg | INTRAVENOUS | Status: AC
  Administered 2012-12-24: 1500 mg via INTRAVENOUS
  Filled 2012-12-23 (×2): qty 1500

## 2012-12-23 MED ORDER — SODIUM CHLORIDE 0.9 % IV SOLN
INTRAVENOUS | Status: AC
  Administered 2012-12-24: 1.6 [IU]/h via INTRAVENOUS
  Filled 2012-12-23: qty 1

## 2012-12-23 MED ORDER — SODIUM CHLORIDE 0.9 % IV SOLN
INTRAVENOUS | Status: DC
  Filled 2012-12-23: qty 30

## 2012-12-23 MED ORDER — POTASSIUM CHLORIDE 2 MEQ/ML IV SOLN
80.0000 meq | INTRAVENOUS | Status: DC
  Filled 2012-12-23: qty 40

## 2012-12-23 MED ORDER — DOPAMINE-DEXTROSE 3.2-5 MG/ML-% IV SOLN
2.0000 ug/kg/min | INTRAVENOUS | Status: DC
  Filled 2012-12-23: qty 250

## 2012-12-23 MED ORDER — DEXTROSE 5 % IV SOLN
1.5000 g | INTRAVENOUS | Status: AC
  Administered 2012-12-24: 750 mg via INTRAVENOUS
  Administered 2012-12-24: 1500 mg via INTRAVENOUS
  Filled 2012-12-23 (×2): qty 1.5

## 2012-12-24 ENCOUNTER — Inpatient Hospital Stay (HOSPITAL_COMMUNITY): Payer: Medicare Other

## 2012-12-24 ENCOUNTER — Inpatient Hospital Stay (HOSPITAL_COMMUNITY): Payer: Medicare Other | Admitting: Certified Registered Nurse Anesthetist

## 2012-12-24 ENCOUNTER — Encounter (HOSPITAL_COMMUNITY)
Admission: RE | Disposition: A | Discharge status: Home / Self Care | Payer: Medicare Other | Source: Ambulatory Visit | Attending: Thoracic Surgery (Cardiothoracic Vascular Surgery)

## 2012-12-24 ENCOUNTER — Encounter (HOSPITAL_COMMUNITY): Payer: Medicare Other | Admitting: Certified Registered Nurse Anesthetist

## 2012-12-24 ENCOUNTER — Encounter (HOSPITAL_COMMUNITY): Payer: Self-pay | Admitting: *Deleted

## 2012-12-24 ENCOUNTER — Inpatient Hospital Stay (HOSPITAL_COMMUNITY)
Admission: RE | Admit: 2012-12-24 | Discharge: 2012-12-30 | DRG: 219 | Disposition: A | Discharge status: Home / Self Care | Payer: Medicare Other | Source: Ambulatory Visit | Attending: Thoracic Surgery (Cardiothoracic Vascular Surgery) | Admitting: Thoracic Surgery (Cardiothoracic Vascular Surgery)

## 2012-12-24 DIAGNOSIS — F17200 Nicotine dependence, unspecified, uncomplicated: Secondary | ICD-10-CM | POA: Diagnosis present

## 2012-12-24 DIAGNOSIS — Z800 Family history of malignant neoplasm of digestive organs: Secondary | ICD-10-CM

## 2012-12-24 DIAGNOSIS — M5137 Other intervertebral disc degeneration, lumbosacral region: Secondary | ICD-10-CM | POA: Diagnosis present

## 2012-12-24 DIAGNOSIS — J9819 Other pulmonary collapse: Secondary | ICD-10-CM | POA: Diagnosis not present

## 2012-12-24 DIAGNOSIS — E876 Hypokalemia: Secondary | ICD-10-CM | POA: Diagnosis not present

## 2012-12-24 DIAGNOSIS — E785 Hyperlipidemia, unspecified: Secondary | ICD-10-CM | POA: Diagnosis present

## 2012-12-24 DIAGNOSIS — Z23 Encounter for immunization: Secondary | ICD-10-CM

## 2012-12-24 DIAGNOSIS — D6959 Other secondary thrombocytopenia: Secondary | ICD-10-CM | POA: Diagnosis not present

## 2012-12-24 DIAGNOSIS — I351 Nonrheumatic aortic (valve) insufficiency: Secondary | ICD-10-CM | POA: Diagnosis present

## 2012-12-24 DIAGNOSIS — M51379 Other intervertebral disc degeneration, lumbosacral region without mention of lumbar back pain or lower extremity pain: Secondary | ICD-10-CM | POA: Diagnosis present

## 2012-12-24 DIAGNOSIS — E7800 Pure hypercholesterolemia, unspecified: Secondary | ICD-10-CM | POA: Diagnosis present

## 2012-12-24 DIAGNOSIS — J4489 Other specified chronic obstructive pulmonary disease: Secondary | ICD-10-CM | POA: Diagnosis present

## 2012-12-24 DIAGNOSIS — R0902 Hypoxemia: Secondary | ICD-10-CM | POA: Diagnosis not present

## 2012-12-24 DIAGNOSIS — R918 Other nonspecific abnormal finding of lung field: Secondary | ICD-10-CM | POA: Diagnosis present

## 2012-12-24 DIAGNOSIS — I2510 Atherosclerotic heart disease of native coronary artery without angina pectoris: Secondary | ICD-10-CM | POA: Diagnosis present

## 2012-12-24 DIAGNOSIS — I739 Peripheral vascular disease, unspecified: Secondary | ICD-10-CM | POA: Diagnosis present

## 2012-12-24 DIAGNOSIS — Z7982 Long term (current) use of aspirin: Secondary | ICD-10-CM

## 2012-12-24 DIAGNOSIS — I359 Nonrheumatic aortic valve disorder, unspecified: Secondary | ICD-10-CM

## 2012-12-24 DIAGNOSIS — Z8249 Family history of ischemic heart disease and other diseases of the circulatory system: Secondary | ICD-10-CM

## 2012-12-24 DIAGNOSIS — G8929 Other chronic pain: Secondary | ICD-10-CM | POA: Diagnosis present

## 2012-12-24 DIAGNOSIS — M5030 Other cervical disc degeneration, unspecified cervical region: Secondary | ICD-10-CM | POA: Diagnosis present

## 2012-12-24 DIAGNOSIS — E8779 Other fluid overload: Secondary | ICD-10-CM | POA: Diagnosis not present

## 2012-12-24 DIAGNOSIS — D62 Acute posthemorrhagic anemia: Secondary | ICD-10-CM | POA: Diagnosis not present

## 2012-12-24 DIAGNOSIS — F329 Major depressive disorder, single episode, unspecified: Secondary | ICD-10-CM | POA: Diagnosis present

## 2012-12-24 DIAGNOSIS — Z79899 Other long term (current) drug therapy: Secondary | ICD-10-CM

## 2012-12-24 DIAGNOSIS — F3289 Other specified depressive episodes: Secondary | ICD-10-CM | POA: Diagnosis present

## 2012-12-24 DIAGNOSIS — F1010 Alcohol abuse, uncomplicated: Secondary | ICD-10-CM | POA: Diagnosis present

## 2012-12-24 DIAGNOSIS — I350 Nonrheumatic aortic (valve) stenosis: Secondary | ICD-10-CM | POA: Diagnosis present

## 2012-12-24 DIAGNOSIS — E559 Vitamin D deficiency, unspecified: Secondary | ICD-10-CM | POA: Diagnosis present

## 2012-12-24 DIAGNOSIS — N170 Acute kidney failure with tubular necrosis: Secondary | ICD-10-CM | POA: Diagnosis not present

## 2012-12-24 DIAGNOSIS — J449 Chronic obstructive pulmonary disease, unspecified: Secondary | ICD-10-CM | POA: Diagnosis present

## 2012-12-24 DIAGNOSIS — J988 Other specified respiratory disorders: Secondary | ICD-10-CM | POA: Diagnosis not present

## 2012-12-24 DIAGNOSIS — I6529 Occlusion and stenosis of unspecified carotid artery: Secondary | ICD-10-CM | POA: Diagnosis present

## 2012-12-24 DIAGNOSIS — Z953 Presence of xenogenic heart valve: Secondary | ICD-10-CM

## 2012-12-24 DIAGNOSIS — Q231 Congenital insufficiency of aortic valve: Secondary | ICD-10-CM

## 2012-12-24 DIAGNOSIS — F411 Generalized anxiety disorder: Secondary | ICD-10-CM | POA: Diagnosis present

## 2012-12-24 HISTORY — PX: AORTIC VALVE REPLACEMENT: SHX41

## 2012-12-24 HISTORY — DX: Presence of xenogenic heart valve: Z95.3

## 2012-12-24 HISTORY — PX: INTRAOPERATIVE TRANSESOPHAGEAL ECHOCARDIOGRAM: SHX5062

## 2012-12-24 LAB — GLUCOSE, CAPILLARY
Glucose-Capillary: 106 mg/dL — ABNORMAL HIGH (ref 70–99)
Glucose-Capillary: 103 mg/dL — ABNORMAL HIGH (ref 70–99)
Glucose-Capillary: 98 mg/dL (ref 70–99)
Glucose-Capillary: 103 mg/dL — ABNORMAL HIGH (ref 70–99)
Glucose-Capillary: 103 mg/dL — ABNORMAL HIGH (ref 70–99)

## 2012-12-24 LAB — POCT I-STAT 3, ART BLOOD GAS (G3+)
Acid-base deficit: 3 mmol/L — ABNORMAL HIGH (ref 0.0–2.0)
Bicarbonate: 24.6 mEq/L — ABNORMAL HIGH (ref 20.0–24.0)
Bicarbonate: 21.4 mEq/L (ref 20.0–24.0)
Bicarbonate: 22.3 mEq/L (ref 20.0–24.0)
Bicarbonate: 21.2 mEq/L (ref 20.0–24.0)
Bicarbonate: 22.1 mEq/L (ref 20.0–24.0)
O2 Saturation: 94 %
O2 Saturation: 94 %
Patient temperature: 36.4
Patient temperature: 36.7
TCO2: 26 mmol/L (ref 0–100)
TCO2: 22 mmol/L (ref 0–100)
TCO2: 22 mmol/L (ref 0–100)
TCO2: 23 mmol/L (ref 0–100)
pCO2 arterial: 46.1 mmHg — ABNORMAL HIGH (ref 35.0–45.0)
pCO2 arterial: 40.6 mmHg (ref 35.0–45.0)
pH, Arterial: 7.335 — ABNORMAL LOW (ref 7.350–7.450)
pH, Arterial: 7.318 — ABNORMAL LOW (ref 7.350–7.450)
pH, Arterial: 7.313 — ABNORMAL LOW (ref 7.350–7.450)
pO2, Arterial: 411 mmHg — ABNORMAL HIGH (ref 80.0–100.0)
pO2, Arterial: 103 mmHg — ABNORMAL HIGH (ref 80.0–100.0)
pO2, Arterial: 69 mmHg — ABNORMAL LOW (ref 80.0–100.0)
pO2, Arterial: 73 mmHg — ABNORMAL LOW (ref 80.0–100.0)
pO2, Arterial: 62 mmHg — ABNORMAL LOW (ref 80.0–100.0)

## 2012-12-24 LAB — PROTIME-INR
INR: 1.28 (ref 0.00–1.49)
Prothrombin Time: 15.7 seconds — ABNORMAL HIGH (ref 11.6–15.2)

## 2012-12-24 LAB — APTT: aPTT: 37 seconds (ref 24–37)

## 2012-12-24 LAB — POCT I-STAT 4, (NA,K, GLUC, HGB,HCT)
Glucose, Bld: 113 mg/dL — ABNORMAL HIGH (ref 70–99)
Glucose, Bld: 103 mg/dL — ABNORMAL HIGH (ref 70–99)
Glucose, Bld: 129 mg/dL — ABNORMAL HIGH (ref 70–99)
HCT: 35 % — ABNORMAL LOW (ref 39.0–52.0)
HCT: 44 % (ref 39.0–52.0)
HCT: 33 % — ABNORMAL LOW (ref 39.0–52.0)
HCT: 39 % (ref 39.0–52.0)
Hemoglobin: 11.9 g/dL — ABNORMAL LOW (ref 13.0–17.0)
Hemoglobin: 15 g/dL (ref 13.0–17.0)
Hemoglobin: 11.2 g/dL — ABNORMAL LOW (ref 13.0–17.0)
Hemoglobin: 11.2 g/dL — ABNORMAL LOW (ref 13.0–17.0)
Hemoglobin: 13.3 g/dL (ref 13.0–17.0)
Potassium: 4.9 mEq/L (ref 3.5–5.1)
Potassium: 4.9 mEq/L (ref 3.5–5.1)
Potassium: 4.6 mEq/L (ref 3.5–5.1)
Potassium: 4 meq/L (ref 3.5–5.1)
Potassium: 3.8 mEq/L (ref 3.5–5.1)
Sodium: 139 mEq/L (ref 135–145)
Sodium: 138 mEq/L (ref 135–145)
Sodium: 140 mEq/L (ref 135–145)
Sodium: 135 mEq/L (ref 135–145)
Sodium: 138 meq/L (ref 135–145)
Sodium: 141 mEq/L (ref 135–145)

## 2012-12-24 LAB — CBC
HCT: 36.6 % — ABNORMAL LOW (ref 39.0–52.0)
HCT: 35.6 % — ABNORMAL LOW (ref 39.0–52.0)
Hemoglobin: 13.2 g/dL (ref 13.0–17.0)
Hemoglobin: 12.6 g/dL — ABNORMAL LOW (ref 13.0–17.0)
MCH: 32.8 pg (ref 26.0–34.0)
MCH: 32.3 pg (ref 26.0–34.0)
MCV: 90.8 fL (ref 78.0–100.0)
Platelets: 75 10*3/uL — ABNORMAL LOW (ref 150–400)
RBC: 4.03 MIL/uL — ABNORMAL LOW (ref 4.22–5.81)
RBC: 3.9 MIL/uL — ABNORMAL LOW (ref 4.22–5.81)
RDW: 13.2 % (ref 11.5–15.5)
WBC: 17.7 10*3/uL — ABNORMAL HIGH (ref 4.0–10.5)

## 2012-12-24 LAB — HEMOGLOBIN AND HEMATOCRIT, BLOOD
HCT: 32.6 % — ABNORMAL LOW (ref 39.0–52.0)
Hemoglobin: 11.8 g/dL — ABNORMAL LOW (ref 13.0–17.0)

## 2012-12-24 LAB — POCT I-STAT, CHEM 8
BUN: 13 mg/dL (ref 6–23)
Calcium, Ion: 1.24 mmol/L — ABNORMAL HIGH (ref 1.12–1.23)
Chloride: 108 mEq/L (ref 96–112)
Creatinine, Ser: 1.4 mg/dL — ABNORMAL HIGH (ref 0.50–1.35)
Glucose, Bld: 100 mg/dL — ABNORMAL HIGH (ref 70–99)
HCT: 37 % — ABNORMAL LOW (ref 39.0–52.0)
Hemoglobin: 12.6 g/dL — ABNORMAL LOW (ref 13.0–17.0)
Potassium: 4.7 mEq/L (ref 3.5–5.1)

## 2012-12-24 LAB — CREATININE, SERUM
Creatinine, Ser: 1.17 mg/dL (ref 0.50–1.35)
GFR calc Af Amer: 80 mL/min — ABNORMAL LOW (ref >90)
GFR calc non Af Amer: 69 mL/min — ABNORMAL LOW (ref >90)

## 2012-12-24 SURGERY — REPLACEMENT, AORTIC VALVE, MINIMALLY INVASIVE
Anesthesia: General | Site: Chest | Wound class: Clean

## 2012-12-24 MED ORDER — ACETAMINOPHEN 160 MG/5ML PO SOLN: 650.0000 mg | Freq: Once | ORAL | Status: AC

## 2012-12-24 MED ORDER — MIDAZOLAM HCL 5 MG/5ML IJ SOLN
INTRAMUSCULAR | Status: DC | PRN
  Administered 2012-12-24: 4 mg via INTRAVENOUS
  Administered 2012-12-24 (×2): 1 mg via INTRAVENOUS
  Administered 2012-12-24 (×2): 2 mg via INTRAVENOUS
  Administered 2012-12-24: 5 mg via INTRAVENOUS

## 2012-12-24 MED ORDER — ALBUMIN HUMAN 5 % IV SOLN
250.0000 mL | INTRAVENOUS | Status: AC | PRN
  Administered 2012-12-24 (×2): 250 mL via INTRAVENOUS

## 2012-12-24 MED ORDER — METOPROLOL TARTRATE 25 MG/10 ML ORAL SUSPENSION
12.5000 mg | Freq: Two times a day (BID) | ORAL | Status: DC
  Filled 2012-12-24 (×3): qty 5

## 2012-12-24 MED ORDER — BISACODYL 5 MG PO TBEC
10.0000 mg | DELAYED_RELEASE_TABLET | Freq: Every day | ORAL | Status: DC
  Administered 2012-12-25 – 2012-12-29 (×5): 10 mg via ORAL
  Filled 2012-12-24 (×5): qty 2

## 2012-12-24 MED ORDER — OXYCODONE HCL 5 MG PO TABS
5.0000 mg | ORAL_TABLET | ORAL | Status: DC | PRN
  Administered 2012-12-25 – 2012-12-27 (×7): 10 mg via ORAL
  Administered 2012-12-27 (×2): 5 mg via ORAL
  Administered 2012-12-27 – 2012-12-30 (×10): 10 mg via ORAL
  Filled 2012-12-24: qty 1
  Filled 2012-12-24 (×5): qty 2
  Filled 2012-12-24: qty 1
  Filled 2012-12-24 (×12): qty 2

## 2012-12-24 MED ORDER — PROPOFOL 10 MG/ML IV BOLUS
INTRAVENOUS | Status: DC | PRN
  Administered 2012-12-24: 60 mg via INTRAVENOUS

## 2012-12-24 MED ORDER — KETOROLAC TROMETHAMINE 30 MG/ML IJ SOLN
30.0000 mg | Freq: Once | INTRAMUSCULAR | Status: AC
  Administered 2012-12-24: 30 mg via INTRAVENOUS
  Filled 2012-12-24: qty 1

## 2012-12-24 MED ORDER — LACTATED RINGERS IV SOLN: 500.0000 mL | Freq: Once | INTRAVENOUS | Status: AC | PRN

## 2012-12-24 MED ORDER — SODIUM CHLORIDE 0.9 % IV SOLN: 250.0000 mL | INTRAVENOUS | Status: DC

## 2012-12-24 MED ORDER — CHLORHEXIDINE GLUCONATE 4 % EX LIQD: 30.0000 mL | CUTANEOUS | Status: DC

## 2012-12-24 MED ORDER — VANCOMYCIN HCL IN DEXTROSE 1-5 GM/200ML-% IV SOLN
1000.0000 mg | Freq: Once | INTRAVENOUS | Status: AC
  Administered 2012-12-24: 1000 mg via INTRAVENOUS
  Filled 2012-12-24: qty 200

## 2012-12-24 MED ORDER — ALBUTEROL SULFATE (5 MG/ML) 0.5% IN NEBU
2.5000 mg | INHALATION_SOLUTION | RESPIRATORY_TRACT | Status: DC | PRN
  Administered 2012-12-24 – 2012-12-26 (×3): 2.5 mg via RESPIRATORY_TRACT
  Filled 2012-12-24 (×3): qty 0.5

## 2012-12-24 MED ORDER — SODIUM CHLORIDE 0.9 % IJ SOLN
3.0000 mL | INTRAMUSCULAR | Status: DC | PRN
  Administered 2012-12-26: 3 mL via INTRAVENOUS

## 2012-12-24 MED ORDER — MIDAZOLAM HCL 2 MG/2ML IJ SOLN: 2.0000 mg | INTRAMUSCULAR | Status: DC | PRN

## 2012-12-24 MED ORDER — SODIUM CHLORIDE 0.9 % IV SOLN
INTRAVENOUS | Status: DC
  Administered 2012-12-24: 15:00:00 via INTRAVENOUS

## 2012-12-24 MED ORDER — PHENYLEPHRINE HCL 10 MG/ML IJ SOLN
10.0000 mg | INTRAVENOUS | Status: DC | PRN
  Administered 2012-12-24: 20 ug/min via INTRAVENOUS

## 2012-12-24 MED ORDER — ROCURONIUM BROMIDE 100 MG/10ML IV SOLN
INTRAVENOUS | Status: DC | PRN
  Administered 2012-12-24 (×5): 50 mg via INTRAVENOUS

## 2012-12-24 MED ORDER — SODIUM CHLORIDE 0.9 % IJ SOLN
3.0000 mL | Freq: Two times a day (BID) | INTRAMUSCULAR | Status: DC
  Administered 2012-12-25 – 2012-12-30 (×9): 3 mL via INTRAVENOUS

## 2012-12-24 MED ORDER — METOPROLOL TARTRATE 1 MG/ML IV SOLN: 2.5000 mg | INTRAVENOUS | Status: DC | PRN

## 2012-12-24 MED ORDER — ASPIRIN 81 MG PO CHEW: 324.0000 mg | CHEWABLE_TABLET | Freq: Every day | ORAL | Status: DC

## 2012-12-24 MED ORDER — ONDANSETRON HCL 4 MG/2ML IJ SOLN
4.0000 mg | Freq: Four times a day (QID) | INTRAMUSCULAR | Status: DC | PRN
  Administered 2012-12-24 – 2012-12-29 (×4): 4 mg via INTRAVENOUS
  Filled 2012-12-24 (×4): qty 2

## 2012-12-24 MED ORDER — EPHEDRINE SULFATE 50 MG/ML IJ SOLN
INTRAMUSCULAR | Status: DC | PRN
  Administered 2012-12-24 (×2): 5 mg via INTRAVENOUS

## 2012-12-24 MED ORDER — PANTOPRAZOLE SODIUM 40 MG PO TBEC
40.0000 mg | DELAYED_RELEASE_TABLET | Freq: Every day | ORAL | Status: DC
  Administered 2012-12-26 – 2012-12-30 (×5): 40 mg via ORAL
  Filled 2012-12-24 (×5): qty 1

## 2012-12-24 MED ORDER — DOCUSATE SODIUM 100 MG PO CAPS
200.0000 mg | ORAL_CAPSULE | Freq: Every day | ORAL | Status: DC
  Administered 2012-12-25 – 2012-12-30 (×6): 200 mg via ORAL
  Filled 2012-12-24 (×6): qty 2

## 2012-12-24 MED ORDER — POTASSIUM CHLORIDE 10 MEQ/50ML IV SOLN
10.0000 meq | INTRAVENOUS | Status: AC
  Administered 2012-12-24 (×3): 10 meq via INTRAVENOUS

## 2012-12-24 MED ORDER — MAGNESIUM SULFATE 40 MG/ML IJ SOLN
4.0000 g | Freq: Once | INTRAMUSCULAR | Status: AC
  Administered 2012-12-24: 4 g via INTRAVENOUS
  Filled 2012-12-24: qty 100

## 2012-12-24 MED ORDER — NITROGLYCERIN IN D5W 200-5 MCG/ML-% IV SOLN: 0.0000 ug/min | INTRAVENOUS | Status: DC

## 2012-12-24 MED ORDER — ARTIFICIAL TEARS OP OINT
TOPICAL_OINTMENT | OPHTHALMIC | Status: DC | PRN
  Administered 2012-12-24: 1 via OPHTHALMIC

## 2012-12-24 MED ORDER — SODIUM CHLORIDE 0.9 % IR SOLN
Status: DC | PRN
  Administered 2012-12-24: 10:00:00

## 2012-12-24 MED ORDER — SODIUM CHLORIDE 0.45 % IV SOLN
INTRAVENOUS | Status: DC
  Administered 2012-12-24: 15:00:00 via INTRAVENOUS

## 2012-12-24 MED ORDER — LACTATED RINGERS IV SOLN
INTRAVENOUS | Status: DC | PRN
  Administered 2012-12-24 (×2): via INTRAVENOUS

## 2012-12-24 MED ORDER — PHENYLEPHRINE HCL 10 MG/ML IJ SOLN
0.0000 ug/min | INTRAVENOUS | Status: DC
  Administered 2012-12-25: 35 ug/min via INTRAVENOUS
  Filled 2012-12-24 (×2): qty 2

## 2012-12-24 MED ORDER — SODIUM CHLORIDE 0.9 % IR SOLN
Status: DC | PRN
  Administered 2012-12-24: 3000 mL

## 2012-12-24 MED ORDER — MORPHINE SULFATE 2 MG/ML IJ SOLN
1.0000 mg | INTRAMUSCULAR | Status: AC | PRN
  Administered 2012-12-24: 2 mg via INTRAVENOUS
  Administered 2012-12-25: 4 mg via INTRAVENOUS

## 2012-12-24 MED ORDER — HEMOSTATIC AGENTS (NO CHARGE) OPTIME
TOPICAL | Status: DC | PRN
  Administered 2012-12-24: 1 via TOPICAL

## 2012-12-24 MED ORDER — PROTAMINE SULFATE 10 MG/ML IV SOLN
INTRAVENOUS | Status: DC | PRN
  Administered 2012-12-24: 100 mg via INTRAVENOUS
  Administered 2012-12-24: 50 mg via INTRAVENOUS
  Administered 2012-12-24: 140 mg via INTRAVENOUS

## 2012-12-24 MED ORDER — LACTATED RINGERS IV SOLN
INTRAVENOUS | Status: DC | PRN
  Administered 2012-12-24: 06:00:00 via INTRAVENOUS

## 2012-12-24 MED ORDER — ACETAMINOPHEN 160 MG/5ML PO SOLN: 1000.0000 mg | Freq: Four times a day (QID) | ORAL | Status: DC

## 2012-12-24 MED ORDER — MORPHINE SULFATE 2 MG/ML IJ SOLN
2.0000 mg | INTRAMUSCULAR | Status: DC | PRN
  Administered 2012-12-24 – 2012-12-25 (×4): 4 mg via INTRAVENOUS
  Filled 2012-12-24 (×2): qty 2
  Filled 2012-12-24: qty 1
  Filled 2012-12-24 (×3): qty 2

## 2012-12-24 MED ORDER — SODIUM CHLORIDE 0.9 % IV SOLN: INTRAVENOUS | Status: DC

## 2012-12-24 MED ORDER — ASPIRIN EC 325 MG PO TBEC
325.0000 mg | DELAYED_RELEASE_TABLET | Freq: Every day | ORAL | Status: DC
  Administered 2012-12-25 – 2012-12-26 (×2): 325 mg via ORAL
  Filled 2012-12-24 (×2): qty 1

## 2012-12-24 MED ORDER — INSULIN ASPART 100 UNIT/ML ~~LOC~~ SOLN
0.0000 [IU] | SUBCUTANEOUS | Status: DC
  Administered 2012-12-25 (×3): 2 [IU] via SUBCUTANEOUS

## 2012-12-24 MED ORDER — METOPROLOL TARTRATE 12.5 MG HALF TABLET
12.5000 mg | ORAL_TABLET | Freq: Two times a day (BID) | ORAL | Status: DC
  Administered 2012-12-26 – 2012-12-30 (×8): 12.5 mg via ORAL
  Filled 2012-12-24 (×13): qty 1

## 2012-12-24 MED ORDER — FAMOTIDINE IN NACL 20-0.9 MG/50ML-% IV SOLN
20.0000 mg | Freq: Two times a day (BID) | INTRAVENOUS | Status: AC
  Administered 2012-12-24: 20 mg via INTRAVENOUS

## 2012-12-24 MED ORDER — HEPARIN SODIUM (PORCINE) 1000 UNIT/ML IJ SOLN
INTRAMUSCULAR | Status: DC | PRN
  Administered 2012-12-24: 32000 [IU] via INTRAVENOUS

## 2012-12-24 MED ORDER — FENTANYL CITRATE 0.05 MG/ML IJ SOLN
INTRAMUSCULAR | Status: DC | PRN
  Administered 2012-12-24 (×2): 250 ug via INTRAVENOUS
  Administered 2012-12-24: 200 ug via INTRAVENOUS
  Administered 2012-12-24: 250 ug via INTRAVENOUS
  Administered 2012-12-24: 50 ug via INTRAVENOUS
  Administered 2012-12-24: 250 ug via INTRAVENOUS

## 2012-12-24 MED ORDER — DEXTROSE 5 % IV SOLN
1.5000 g | Freq: Two times a day (BID) | INTRAVENOUS | Status: DC
  Administered 2012-12-24 – 2012-12-25 (×2): 1.5 g via INTRAVENOUS
  Filled 2012-12-24 (×3): qty 1.5

## 2012-12-24 MED ORDER — ALBUMIN HUMAN 5 % IV SOLN
INTRAVENOUS | Status: DC | PRN
  Administered 2012-12-24: 12:00:00 via INTRAVENOUS

## 2012-12-24 MED ORDER — LACTATED RINGERS IV SOLN: INTRAVENOUS | Status: DC

## 2012-12-24 MED ORDER — DEXMEDETOMIDINE HCL IN NACL 200 MCG/50ML IV SOLN
0.1000 ug/kg/h | INTRAVENOUS | Status: DC
  Administered 2012-12-24: 0.5 ug/kg/h via INTRAVENOUS
  Administered 2012-12-25: 0.2 ug/kg/h via INTRAVENOUS
  Filled 2012-12-24 (×2): qty 50

## 2012-12-24 MED ORDER — 0.9 % SODIUM CHLORIDE (POUR BTL) OPTIME
TOPICAL | Status: DC | PRN
  Administered 2012-12-24: 6000 mL

## 2012-12-24 MED ORDER — BISACODYL 10 MG RE SUPP: 10.0000 mg | Freq: Every day | RECTAL | Status: DC

## 2012-12-24 MED ORDER — ACETAMINOPHEN 650 MG RE SUPP
650.0000 mg | Freq: Once | RECTAL | Status: AC
  Administered 2012-12-24: 650 mg via RECTAL

## 2012-12-24 MED ORDER — ACETAMINOPHEN 500 MG PO TABS
1000.0000 mg | ORAL_TABLET | Freq: Four times a day (QID) | ORAL | Status: AC
  Administered 2012-12-25 – 2012-12-29 (×14): 1000 mg via ORAL
  Filled 2012-12-24 (×20): qty 2

## 2012-12-24 MED ORDER — LACTATED RINGERS IV SOLN
INTRAVENOUS | Status: DC | PRN
  Administered 2012-12-24: 07:00:00 via INTRAVENOUS

## 2012-12-24 MED ORDER — INSULIN REGULAR BOLUS VIA INFUSION
0.0000 [IU] | Freq: Three times a day (TID) | INTRAVENOUS | Status: DC
  Filled 2012-12-24: qty 10

## 2012-12-24 MED FILL — Sodium Bicarbonate IV Soln 8.4%: INTRAVENOUS | Qty: 50 | Status: AC

## 2012-12-24 MED FILL — Heparin Sodium (Porcine) Inj 1000 Unit/ML: INTRAMUSCULAR | Qty: 10 | Status: AC

## 2012-12-24 MED FILL — Lidocaine HCl IV Inj 20 MG/ML: INTRAVENOUS | Qty: 5 | Status: AC

## 2012-12-24 MED FILL — Mannitol IV Soln 20%: INTRAVENOUS | Qty: 500 | Status: AC

## 2012-12-24 MED FILL — Sodium Chloride Irrigation Soln 0.9%: Qty: 3000 | Status: AC

## 2012-12-24 MED FILL — Electrolyte-R (PH 7.4) Solution: INTRAVENOUS | Qty: 3000 | Status: AC

## 2012-12-24 SURGICAL SUPPLY — 119 items
ADAPTER CARDIO PERF ANTE/RETRO (ADAPTER) ×3 IMPLANT
BAG DECANTER FOR FLEXI CONT (MISCELLANEOUS) ×3 IMPLANT
BATTERY PACK STR FOR DRIVER (MISCELLANEOUS) ×3 IMPLANT
BENZOIN TINCTURE PRP APPL 2/3 (GAUZE/BANDAGES/DRESSINGS) ×3 IMPLANT
BLADE STERNUM SYSTEM 6 (BLADE) ×3 IMPLANT
BLADE SURG 11 STRL SS (BLADE) ×3 IMPLANT
CANISTER SUCTION 2500CC (MISCELLANEOUS) ×6 IMPLANT
CANNULA FEM VENOUS REMOTE 22FR (CANNULA) ×3 IMPLANT
CANNULA FEMORAL ART 14 SM (MISCELLANEOUS) ×3 IMPLANT
CANNULA GUNDRY RCSP 15FR (MISCELLANEOUS) ×6 IMPLANT
CANNULA OPTISITE PERFUSION 16F (CANNULA) IMPLANT
CANNULA OPTISITE PERFUSION 18F (CANNULA) ×3 IMPLANT
CATH ENDOVENT PULMONARY (CATHETERS) ×3 IMPLANT
CATH HEART VENT LEFT (CATHETERS) ×2 IMPLANT
CATH ROBINSON RED A/P 18FR (CATHETERS) ×3 IMPLANT
CONN ST 1/4X3/8  BEN (MISCELLANEOUS) ×2
CONN ST 1/4X3/8 BEN (MISCELLANEOUS) ×4 IMPLANT
CONNECTOR 1/2X3/8X1/2 3 WAY (MISCELLANEOUS) ×1
CONNECTOR 1/2X3/8X1/2 3WAY (MISCELLANEOUS) ×2 IMPLANT
CONT SPEC 4OZ CLIKSEAL STRL BL (MISCELLANEOUS) ×3 IMPLANT
CONT SPEC STER OR (MISCELLANEOUS) ×3 IMPLANT
COR-KNOT ELITE COMBO KIT (Prosthesis & Implant Heart) ×3 IMPLANT
COVER MAYO STAND STRL (DRAPES) ×3 IMPLANT
COVER SURGICAL LIGHT HANDLE (MISCELLANEOUS) ×6 IMPLANT
CRADLE DONUT ADULT HEAD (MISCELLANEOUS) ×3 IMPLANT
DERMABOND ADVANCED (GAUZE/BANDAGES/DRESSINGS) ×2
DERMABOND ADVANCED .7 DNX12 (GAUZE/BANDAGES/DRESSINGS) ×4 IMPLANT
DEVICE SUT CK QUICK LOAD INDV (Prosthesis & Implant Heart) ×24 IMPLANT
DEVICE TROCAR PUNCTURE CLOSURE (ENDOMECHANICALS) ×3 IMPLANT
DRAIN CHANNEL 28F RND 3/8 FF (WOUND CARE) ×6 IMPLANT
DRAPE BILATERAL SPLIT (DRAPES) ×3 IMPLANT
DRAPE C-ARM 42X72 X-RAY (DRAPES) ×3 IMPLANT
DRAPE CV SPLIT W-CLR ANES SCRN (DRAPES) ×3 IMPLANT
DRAPE INCISE IOBAN 66X45 STRL (DRAPES) ×3 IMPLANT
DRAPE SLUSH/WARMER DISC (DRAPES) IMPLANT
DRILL BIT 2.2MM W/14M STOP (BIT) ×3 IMPLANT
DRSG COVADERM 4X8 (GAUZE/BANDAGES/DRESSINGS) ×3 IMPLANT
ELECT BLADE 4.0 EZ CLEAN MEGAD (MISCELLANEOUS) ×3
ELECT BLADE 6.5 EXT (BLADE) ×3 IMPLANT
ELECT REM PT RETURN 9FT ADLT (ELECTROSURGICAL) ×6
ELECTRODE BLDE 4.0 EZ CLN MEGD (MISCELLANEOUS) ×2 IMPLANT
ELECTRODE REM PT RTRN 9FT ADLT (ELECTROSURGICAL) ×4 IMPLANT
FEMORAL VENOUS CANN RAP (CANNULA) IMPLANT
GLOVE BIO SURGEON STRL SZ 6 (GLOVE) ×15 IMPLANT
GLOVE BIO SURGEON STRL SZ 6.5 (GLOVE) ×12 IMPLANT
GLOVE BIO SURGEON STRL SZ7.5 (GLOVE) ×6 IMPLANT
GLOVE ORTHO TXT STRL SZ7.5 (GLOVE) ×9 IMPLANT
GOWN STRL NON-REIN LRG LVL3 (GOWN DISPOSABLE) ×18 IMPLANT
INSERT CONFORM CROSS CLAMP 66M (MISCELLANEOUS) IMPLANT
INSERT CONFORM CROSS CLAMP 86M (MISCELLANEOUS) IMPLANT
KIT BASIN OR (CUSTOM PROCEDURE TRAY) ×3 IMPLANT
KIT DILATOR VASC 18G NDL (KITS) ×3 IMPLANT
KIT DRAINAGE VACCUM ASSIST (KITS) ×3 IMPLANT
KIT ROOM TURNOVER OR (KITS) ×3 IMPLANT
KIT SUCTION CATH 14FR (SUCTIONS) ×6 IMPLANT
LEAD PACING MYOCARDI (MISCELLANEOUS) ×3 IMPLANT
LINE VENT (MISCELLANEOUS) ×3 IMPLANT
NDL SUT 1 .5 CRC FRENCH EYE (NEEDLE) ×2 IMPLANT
NEEDLE AORTIC ROOT 14G 7F (CATHETERS) ×3 IMPLANT
NEEDLE FRENCH EYE (NEEDLE) ×1
NS IRRIG 1000ML POUR BTL (IV SOLUTION) ×18 IMPLANT
PACK OPEN HEART (CUSTOM PROCEDURE TRAY) ×3 IMPLANT
PAD ARMBOARD 7.5X6 YLW CONV (MISCELLANEOUS) ×6 IMPLANT
PAD ELECT DEFIB RADIOL ZOLL (MISCELLANEOUS) ×3 IMPLANT
PLATE UNIVERSAL 8 HOLE (Plate) ×3 IMPLANT
RETRACTOR TRL SOFT TISSUE LG (INSTRUMENTS) IMPLANT
RETRACTOR TRM SOFT TISSUE 7.5 (INSTRUMENTS) IMPLANT
RUBBERBAND STERILE (MISCELLANEOUS) IMPLANT
SCREW SELF TAP MAT 2.9X14MM (Screw) ×12 IMPLANT
SET CANNULATION TOURNIQUET (MISCELLANEOUS) ×3 IMPLANT
SET CARDIOPLEGIA MPS 5001102 (MISCELLANEOUS) ×3 IMPLANT
SET IRRIG TUBING LAPAROSCOPIC (IRRIGATION / IRRIGATOR) ×3 IMPLANT
SOLUTION ANTI FOG 6CC (MISCELLANEOUS) ×6 IMPLANT
SPOGE SURGIFLO 8M (HEMOSTASIS) ×1
SPONGE GAUZE 4X4 12PLY (GAUZE/BANDAGES/DRESSINGS) ×3 IMPLANT
SPONGE LAP 4X18 X RAY DECT (DISPOSABLE) ×3 IMPLANT
SPONGE SURGIFLO 8M (HEMOSTASIS) ×2 IMPLANT
STRIP CLOSURE SKIN 1/2X4 (GAUZE/BANDAGES/DRESSINGS) ×3 IMPLANT
SUT BONE WAX W31G (SUTURE) ×3 IMPLANT
SUT E-PACK MINIMALLY INVASIVE (SUTURE) ×3 IMPLANT
SUT ETHIBON 2 0 V 52N 30 (SUTURE) ×9 IMPLANT
SUT ETHIBOND X763 2 0 SH 1 (SUTURE) ×3 IMPLANT
SUT GORETEX CV 4 TH 22 36 (SUTURE) IMPLANT
SUT GORETEX CV4 TH-18 (SUTURE) ×6 IMPLANT
SUT MNCRL AB 3-0 PS2 18 (SUTURE) ×6 IMPLANT
SUT PROLENE 3 0 SH1 36 (SUTURE) ×24 IMPLANT
SUT PROLENE 4 0 RB 1 (SUTURE) ×5
SUT PROLENE 4-0 RB1 .5 CRCL 36 (SUTURE) ×10 IMPLANT
SUT PROLENE 5 0 C 1 36 (SUTURE) ×6 IMPLANT
SUT PROLENE 6 0 C 1 30 (SUTURE) ×6 IMPLANT
SUT SILK  1 MH (SUTURE)
SUT SILK 1 MH (SUTURE) IMPLANT
SUT SILK 1 TIES 10X30 (SUTURE) ×3 IMPLANT
SUT SILK 2 0 SH CR/8 (SUTURE) ×3 IMPLANT
SUT SILK 2 0 TIES 10X30 (SUTURE) ×3 IMPLANT
SUT SILK 2 0SH CR/8 30 (SUTURE) ×3 IMPLANT
SUT SILK 3 0 (SUTURE) ×1
SUT SILK 3 0 SH CR/8 (SUTURE) ×3 IMPLANT
SUT SILK 3 0SH CR/8 30 (SUTURE) ×3 IMPLANT
SUT SILK 3-0 18XBRD TIE 12 (SUTURE) ×2 IMPLANT
SUT TEM PAC WIRE 2 0 SH (SUTURE) ×6 IMPLANT
SUT VIC AB 2-0 CTX 36 (SUTURE) ×6 IMPLANT
SUT VIC AB 2-0 UR6 27 (SUTURE) ×6 IMPLANT
SUT VIC AB 3-0 SH 8-18 (SUTURE) ×15 IMPLANT
SUT VICRYL 2 TP 1 (SUTURE) IMPLANT
SYRINGE 10CC LL (SYRINGE) ×3 IMPLANT
SYSTEM SAHARA CHEST DRAIN ATS (WOUND CARE) ×3 IMPLANT
TAPE CLOTH SURG 4X10 WHT LF (GAUZE/BANDAGES/DRESSINGS) ×3 IMPLANT
TAPE PAPER 2X10 WHT MICROPORE (GAUZE/BANDAGES/DRESSINGS) ×3 IMPLANT
TOWEL OR 17X24 6PK STRL BLUE (TOWEL DISPOSABLE) ×3 IMPLANT
TOWEL OR 17X26 10 PK STRL BLUE (TOWEL DISPOSABLE) ×6 IMPLANT
TRAY FOLEY IC TEMP SENS 14FR (CATHETERS) ×3 IMPLANT
TROCAR XCEL BLADELESS 5X75MML (TROCAR) ×3 IMPLANT
TROCAR XCEL NON-BLD 11X100MML (ENDOMECHANICALS) ×6 IMPLANT
UNDERPAD 30X30 INCONTINENT (UNDERPADS AND DIAPERS) ×3 IMPLANT
VALVE MAGNA EASE AORTIC 23MM (Prosthesis & Implant Heart) ×3 IMPLANT
VENT LEFT HEART 12002 (CATHETERS) ×3
WATER STERILE IRR 1000ML POUR (IV SOLUTION) ×6 IMPLANT
WIRE BENTSON .035X145CM (WIRE) ×3 IMPLANT

## 2012-12-24 NOTE — Anesthesia Procedure Notes (Signed)
Procedure Name: Intubation Date/Time: 12/24/2012 8:03 AM Performed by: Purcell Nails Pre-anesthesia Checklist: Patient identified, Emergency Drugs available, Suction available and Patient being monitored Patient Re-evaluated:Patient Re-evaluated prior to inductionOxygen Delivery Method: Circle system utilized Preoxygenation: Pre-oxygenation with 100% oxygen Intubation Type: IV induction Ventilation: Mask ventilation without difficulty, Oral airway inserted - appropriate to patient size and Two handed mask ventilation required Laryngoscope Size: Mac and 3 Grade View: Grade III Endobronchial tube: Double lumen EBT and 37 Fr Number of attempts: 2 Airway Equipment and Method: Stylet,  Oral airway,  Fiberoptic brochoscope and Video-laryngoscopy Placement Confirmation: ETT inserted through vocal cords under direct vision,  positive ETCO2 and breath sounds checked- equal and bilateral Tube secured with: Tape Dental Injury: Bloody posterior oropharynx  Comments: DL x 1 with Mac 3. Grade 3 view. Intubated with bougie and single lumen ETT inserted atraumatically. Cook exchange catheter used to insert double lumen. Intubated esophagus. Glidescope utilized with grade 2 view to insert double lumen. Bloody posterior oropharynx noted with glidescope.

## 2012-12-24 NOTE — Anesthesia Postprocedure Evaluation (Signed)
  Anesthesia Post-op Note  Patient: [name] [name]  Procedure(s) Performed: Procedure(s): MINIMALLY INVASIVE AORTIC VALVE REPLACEMENT (AVR) (N/A) INTRAOPERATIVE TRANSESOPHAGEAL ECHOCARDIOGRAM (N/A)  Patient Location: SICU  Anesthesia Type:General  Level of Consciousness: sedated and Patient remains intubated per anesthesia plan  Airway and Oxygen Therapy: Patient remains intubated per anesthesia plan and Patient placed on Ventilator (see vital sign flow sheet for setting)  Post-op Pain: none  Post-op Assessment: Post-op Vital signs reviewed, Patient's Cardiovascular Status Stable and Respiratory Function Stable  Post-op Vital Signs: stable  Complications: No apparent anesthesia complications

## 2012-12-24 NOTE — Interval H&P Note (Signed)
History and Physical Interval Note:  12/24/2012 7:26 AM  [name] [name]  has presented today for surgery, with the diagnosis of aortic stenosis  The various methods of treatment have been discussed with the patient and family. After consideration of risks, benefits and other options for treatment, the patient has consented to  Procedure(s): MINIMALLY INVASIVE AORTIC VALVE REPLACEMENT (AVR) (N/A) INTRAOPERATIVE TRANSESOPHAGEAL ECHOCARDIOGRAM (N/A) as a surgical intervention .  The patient's history has been reviewed, patient examined, no change in status, stable for surgery.  I have reviewed the patient's chart and labs.  Questions were answered to the patient's satisfaction.     OWEN,CLARENCE H

## 2012-12-24 NOTE — Progress Notes (Signed)
Echocardiogram OR Echocardiogram Transesophageal has been performed.  12/24/2012 8:57 AM Gertie Fey, RVT, RDCS, RDMS

## 2012-12-24 NOTE — Brief Op Note (Addendum)
      [address]       [place] [address]             [phone]     12/24/2012  12:34 PM  PATIENT:  [name]  52 y.o. male  PRE-OPERATIVE DIAGNOSIS:  aortic stenosis  POST-OPERATIVE DIAGNOSIS:  aortic stenosis  PROCEDURE:  Procedure(s): MINIMALLY INVASIVE AORTIC VALVE REPLACEMENT (AVR)#23 MAGNAEASE BIOPROSTHETIC INTRAOPERATIVE TRANSESOPHAGEAL ECHOCARDIOGRAM  SURGEON:    Purcell Nails, MD  ASSISTANTS:  Rowe Clack, PA-C  ANESTHESIA:   Kipp Brood, MD  CROSSCLAMP TIME:   67'  CARDIOPULMONARY BYPASS TIME: 134'  FINDINGS:  Type I bicuspid aortic valve   Severe aortic stenosis  Moderate aortic insufficiency  Normal LV systolic function  COMPLICATIONS: none  PRE-OPERATIVE WEIGHT: 84kg  PATIENT DISPOSITION:   TO SICU IN STABLE CONDITION  OWEN,CLARENCE H 12/24/2012 1:30 PM

## 2012-12-24 NOTE — Op Note (Addendum)
CARDIOTHORACIC SURGERY OPERATIVE NOTE  Date of Procedure:  12/24/2012  Preoperative Diagnosis: Severe Aortic Stenosis   Postoperative Diagnosis: Same   Procedure:    Minimally Invasive Aortic Valve Replacement  Edwards Magna Ease Pericardial Tissue Valve (size 23mm, catalog #[id number], serial #[id number])  Placed via Right Anterior Mini Thoracotomy Approach   Surgeon: [name]. Cornelius Moras, MD  Assistant: Rowe Clack, PA-C  Anesthesia: Kipp Brood, MD   Operative Findings: Type I bicuspid aortic valve  Severe aortic stenosis  Moderate aortic insufficiency  Normal LV systolic function         BRIEF CLINICAL NOTE AND INDICATIONS FOR SURGERY  Patient is a 52 year old disabled white male from [facility] with bicuspid aortic valve disease including aortic stenosis and aortic insufficiency who has been referred for possible elective surgical intervention. The patient states that he was first noted to have a heart murmur on physical exam performed by his neurosurgeon in 1999. He has been followed by Dr. Hanley Hays for the last several years with serial echocardiograms which have revealed normal LV systolic function and severe aortic stenosis. Initially the patient remained asymptomatic, although recently he has noticed some changes in his exercise tolerance with mild exertional shortness of breath. He underwent a stress echocardiogram 11/19/2012. Patient had poor exercise tolerance stopping only 4 1/2 minutes into a standard Bruce protocol due to shortness of breath. Peak exercise echo findings demonstrated that the patient's transvalvular gradient across the aortic valve reportedly increased to greater than 90 mm mercury post exercise up from baseline 60-70 mm mercury. The patient subsequently underwent transesophageal echocardiogram confirming the presence of bicuspid aortic valve with moderate to severe aortic stenosis and moderate to severe aortic regurgitation. Left ventricular size and  systolic function appeared normal with ejection fraction estimated 60-65%. Patient was referred for elective surgical consultation. He was originally seen in consultation on 11/30/2012. Since then he underwent cardiac catheterization by Dr. Allyson Sabal. He was found to have moderate nonobstructive coronary artery disease. Pulmonary artery pressures were normal. The patient has been seen in consultation and counseled at length regarding the indications, risks and potential benefits of surgery.  All questions have been answered, and the patient provides full informed consent for the operation as described.     DETAILS OF THE OPERATIVE PROCEDURE  Preparation:  The patient is brought to the operating room on the above mentioned date and central monitoring was established by the anesthesia team including placement of Swan-Ganz catheter through the left internal jugular vein.  A radial arterial line is placed. The patient is placed in the supine position on the operating table.  Intravenous antibiotics are administered. General endotracheal anesthesia is induced uneventfully. The patient is initially intubated using a dual lumen endotracheal tube.  A Foley catheter is placed.  Baseline transesophageal echocardiogram was performed.  Findings were notable for bicuspid aortic valve with severe aortic stenosis and moderate aortic insufficiency.  There was normal LV systolic function.  The patient is placed in the supine position with their neck gently extended and turned to the left.   The patient's right neck, chest, abdomen, both groins, and both lower extremities are prepared and draped in a sterile manner. A time out procedure is performed.   Surgical Approach:  A right miniature anteriorthoracotomy incision is performed. The incision is placed immediately over the 2nd intercostal space.  The muscle fibers of the pectoralis major muscle are split longitudinally and the right pleural space is entered through  the 2nd intercostal space. The 2nd costal cartilage  is divided at it's insertion onto the sternum.  The right internal mammary artery and veins are ligated and divided.  A soft tissue retractor is placed.  Two 11 mm ports are placed through separate stab incisions inferiorly. The right pleural space is insufflated continuously with carbon dioxide gas through the posterior port during the remainder of the operation.     Extracorporeal Cardiopulmonary Bypass and Myocardial Protection:  A small incision is made in the right inguinal crease and the anterior surface of the right common femoral artery and right common femoral vein are identified.  The patient is placed in Trendelenburg position. The right internal jugular vein is cannulated with Seldinger technique and a guidewire advanced into the right atrium. The patient is heparinized systemically.  A 14 French pediatric femoral venous cannula is placed through the right internal jugular vein into the superior vena cava.  Pursestring sutures are placed on the anterior surface of the right common femoral vein and right common femoral artery. The right common femoral vein is cannulated with the Seldinger technique and a guidewire is advanced under transesophageal echocardiogram guidance through the right atrium. The femoral vein is cannulated with a long 22 French femoral venous cannula. The right common femoral artery is cannulated with Seldinger technique and a flexible guidewire is advanced until it can be appreciated intraluminally in the descending thoracic aorta on transesophageal echocardiogram. The femoral artery is cannulated with an 18 French femoral arterial cannula.  Adequate heparinization is verified.      The entire pre-bypass portion of the operation was notable for stable hemodynamics.  Cardiopulmonary bypass was begun.  Vacuum assist venous drainage is utilized. An incision is made in the pericardium over the ascending aorta and extended in  both directions. Silk traction sutures are placed to retract the pericardium.  Venous drainage and exposure are notably excellent. A retrograde cardioplegia cannula is placed through the right atrium into the coronary sinus using transesophageal echocardiogram guidance.  A left ventricular vent is placed through the right superior pulmonary vein.  An antegrade cardioplegia cannula is placed in the ascending aorta.    The patient is cooled to 28C systemic temperature.  The aortic cross clamp is applied and cold blood cardioplegia is delivered initially in an antegrade fashion through the aortic root.  Supplemental cardioplegia is given retrograde through the coronary sinus catheter. The initial cardioplegic arrest is rapid with early diastolic arrest.  Repeat doses of cardioplegia are administered intermittently every 20 to 30 minutes throughout the entire cross clamp portion of the operation through the coronary sinus catheter in order to maintain completely flat electrocardiogram.  Myocardial protection was felt to be excellent.   Aortic Valve Replacement:  An oblique transverse aortotomy incision was performed.  The aortic valve was inspected and notable for congenital bicuspid valve with 3 sinuses of Valsalva.  There was fusion of the left and right leaflets with partial fusion between the right and non-coronary leaflets.  There was severe aortic stenosis and significant aortic insufficiency.  The aortic valve leaflets were excised sharply and the aortic annulus decalcified.  Decalcification was notably extensive but straightforward.  The aortic annulus was sized to accept a 23 mm prosthesis.  The aortic root and left ventricle were irrigated with copious cold saline solution.  Aortic valve replacement was performed using interrupted horizontal mattress 2-0 Ethibond pledgeted sutures with pledgets in the subannular position.  An [facility] Ease pericardial tissue valve (size 23 mm, model # 3300TFX,  serial # [id number]) was implanted  uneventfully. The valve seated appropriately with adequate space beneath the left main and right coronary artery.  All sutures were secured using Cor-knots.   Procedure Completion:  The aortotomy was closed using a 2-layer closure of running 4-0 Prolene suture.  One final dose of warm retrograde "hot shot" cardioplegia was administered retrograde through the coronary sinus catheter while all air was evacuated through the aortic root.  The aortic cross clamp was removed after a total cross clamp time of 80 minutes.  Epicardial pacing wires are fixed to the right ventricular outflow tract and to the right atrial appendage. The patient is rewarmed to 37C temperature.  The pericardial sac was drained using a 28 French Bard drain placed through the anterior port incision.   The patient is weaned and disconnected from cardiopulmonary bypass.  The patient's rhythm at separation from bypass was sinus.  The patient was weaned from bypass without any inotropic support. Total cardiopulmonary bypass time for the operation was 134 minutes.  Followup transesophageal echocardiogram performed after separation from bypass revealed a well-seated bioprosthetic tissue valve in the aortic position with no perivalvular leak.  Left ventricular function was unchanged from preoperatively.    The femoral arterial and venous cannulae were removed uneventfully. There was a palpable pulse in the distal right common femoral artery after removal of the cannula.    Protamine was administered to reverse the anticoagulation.   The right internal jugular venous cannula is removed and manual pressure held for 15 minutes.  Single lung ventilation was begun. The aortotomy closure was inspected for hemostasis. The right pleural space is irrigated with saline solution and inspected for hemostasis. The right pleural space was drained using a 28 French Bard drain placed through the posterior port incision. The 2nd  costal cartilage is repaired using a small universal Synthes plate.  The miniature thoracotomy incision was closed in multiple layers in routine fashion. The right groin incision was inspected for hemostasis and closed in multiple layers in routine fashion.   The post-bypass portion of the operation was notable for stable rhythm and hemodynamics.   No blood products were administered during the operation.   Disposition:  The patient tolerated the procedure well.  The patient was reintubated using a single lumen endotracheal tube and subsequently transported to the surgical intensive care unit in stable condition. There were no intraoperative complications. All sponge instrument and needle counts are verified correct at completion of the operation.     [name]. Cornelius Moras MD 12/24/2012 1:41 PM

## 2012-12-24 NOTE — Progress Notes (Signed)
Weaned to rate 4, 40% per SICU protocol.  RN aware.  Pt follows commands and can lift head off bed x 3 seconds.

## 2012-12-24 NOTE — CV Procedure (Signed)
Intra-operative Transesophageal Echocardiography Report:  [name] [name] is a 52 year old male with a history of a congenitally bicuspid aortic valve with severe aortic stenosis and moderate aortic insufficiency who has developed gradually worsening symptoms of exertional shortness of breath over the last several years.Cardiac catheterization on 12/10/2012 revealed moderate nonobstructive coronary artery disease with normal left ventricular function. He is now scheduled to undergo aortic valve replacement using the minimally invasive approach by Dr. Cornelius Moras. Intraoperative transesophageal echocardiography was indicated to assess the aortic valve and to assist with the procedure, to serve as a monitor for intraoperative volume status, and to serve as a monitor for intraoperative right and left ventricular function.  The patient was brought to the operating room at [facility] and general anesthesia was induced without difficulty. Following endotracheal intubation and orogastric suctioning, the transesophageal echocardiography probe was inserted into the esophagus without difficulty.  Impression: Pre-bypass findings:  1. Aortic valve: The aortic valve was bicuspid. There appeared to be a raphe between the right and left coronary cusps. There was obvious restriction to opening. There was moderate calcification of the valve noted. The peak transaortic velocity was 3.96 m/s with a peak gradient of 63 mm of mercury and a mean gradient of 31 mm of mercury. Aortic valve area using the VTI method was 0.63 cm and 0.53 cm using the V-max method. The aortic annulus measured 2.2 cm in diameter. The aortic root at the sinuses of Valsalva measured 3.7 cm. The sinotubular ridge measured 2.4 cm, and the proximal ascending aorta measurde 3.15 cm. There was moderate mitral insufficiency noted.  2. Mitral valve: The mitral valve opened normally and the leaflets coapted well without prolapsing,  billowing or flail  segments noted. There was trace mitral insufficiency.   3. Left ventricle: The left ventricular size was at the upper limits of normal and measured 4.95 cm at end diastole at the mid-papillary view in the transgastric short axis view. There was mild to moderate  concentric left ventricular hypertrophy. The inferior wall measured 1.05 cm in the anterior wall measured 1.18 cm at end diastole at the mid-papillary level in the transgastric short axis view. There were no regional wall motion abnormalities and  There was normal appearing systolic function with an ejection fraction estimated at 60%. There was no thrombus noted in the left ventricular apex.  4. Right ventricle: The right ventricular cavity was of normal size and there was normal contractility the right ventricular free wall.  5. Tricuspid valve. The tricuspid valve appeared structurally normal with trace tricuspid insufficiency  6. Interatrial septum: The interatrial septum was intact without evidence of patent foramen ovale or atrial septal defect by color Doppler or bubble study.   7. Left atrium:  There was no thrombus noted in the left atrial cavity or left atrial appendage.  8. Ascending aorta: The ascending aorta was non-aneurysmal with the measurements noted above. There was a well-defined aortic root and sinotubular ridge without effacement of the sinuses of Valsalva. There was no significant atheromatous disease noted within the walls of the descending aorta.  9. Descending aorta: There was mild atheromatous disease noted within the wall the descending aorta. The descending aorta measured 2.3 cm in diameter.  Post-bypass findings:  1. Aortic valve: There was a bioprosthetic valve in aortic position. The leaflets appeared to open normally. In some views there appeared to be a very trivial amount of aortic insufficiency which  appeared within the sewing ring and was not paravalvular. This appeared to be hemodynamically  insignificant. The trans-aortic mean gradient was 11 mm of mercury.  2. Mitral valve: The mitral valve appeared unchanged from the pre-bypass study. The leaflets opened normally and coapted well with trace mitral insufficiency.  3. Left ventricle: The left ventricular systolic function appeared normal. There were no regional wall motion abnormalities and the ejection fraction was estimated at 60-65%.  4. Right ventricle: The right ventricular cavity was of normal size with normal contractility of the right ventricular free wall.  5. Tricuspid valve: The tricuspid valve appeared structurally normal with trace tricuspid insufficiency.  Kipp Brood, M.D.

## 2012-12-24 NOTE — Preoperative (Addendum)
Beta Blockers   Reason not to administer Beta Blockers:Not Applicable 

## 2012-12-24 NOTE — Anesthesia Preprocedure Evaluation (Addendum)
Anesthesia Evaluation  Patient identified by MRN, date of birth, ID band Patient awake    Reviewed: Allergy & Precautions, H&P , NPO status , Patient's Chart, lab work & pertinent test results  Airway Mallampati: III TM Distance: <3 FB Neck ROM: Full    Dental  (+) Dental Advisory Given and Teeth Intact   Pulmonary COPD         Cardiovascular + Peripheral Vascular Disease + Valvular Problems/Murmurs  -aortic insufficiency and aortic stenosis -bicuspid aortic valve -carotid stenosis   Neuro/Psych PSYCHIATRIC DISORDERS Depression    GI/Hepatic (+)     substance abuse  alcohol use, History of drug and alcohol abuse   Endo/Other    Renal/GU      Musculoskeletal   Abdominal   Peds  Hematology   Anesthesia Other Findings   Reproductive/Obstetrics                          Anesthesia Physical Anesthesia Plan  ASA: III  Anesthesia Plan: General   Post-op Pain Management:    Induction: Intravenous  Airway Management Planned: Oral ETT  Additional Equipment: Arterial line, CVP, PA Cath, TEE and Ultrasound Guidance Line Placement  Intra-op Plan:   Post-operative Plan: Post-operative intubation/ventilation  Informed Consent: I have reviewed the patients History and Physical, chart, labs and discussed the procedure including the risks, benefits and alternatives for the proposed anesthesia with the patient or authorized representative who has indicated his/her understanding and acceptance.   Dental advisory given  Plan Discussed with: Anesthesiologist and Surgeon  Anesthesia Plan Comments:         Anesthesia Quick Evaluation

## 2012-12-24 NOTE — Transfer of Care (Signed)
Immediate Anesthesia Transfer of Care Note  Patient: [name] [name]  Procedure(s) Performed: Procedure(s): MINIMALLY INVASIVE AORTIC VALVE REPLACEMENT (AVR) (N/A) INTRAOPERATIVE TRANSESOPHAGEAL ECHOCARDIOGRAM (N/A)  Patient Location: SICU  Anesthesia Type:General  Level of Consciousness: sedated and Patient remains intubated per anesthesia plan  Airway & Oxygen Therapy: Patient remains intubated per anesthesia plan and Patient placed on Ventilator (see vital sign flow sheet for setting)  Post-op Assessment: Report given to PACU RN and Post -op Vital signs reviewed and stable  Post vital signs: Reviewed and stable  Complications: No apparent anesthesia complications

## 2012-12-24 NOTE — Progress Notes (Signed)
CT surgery pm rounds  Stable post AVR Bloody thick airway secretions- receiving nebs Stable hemodynamics

## 2012-12-24 NOTE — Procedures (Signed)
Extubation Procedure Note  Patient Details:   Name: [name] [name] DOB: 05-01-1960 MRN: [id number]   Airway Documentation:   NIF -60, VC 1.2 L, + air leak around cuff  Evaluation  O2 sats: stable throughout Complications: No apparent complications Patient did tolerate procedure well. Bilateral Breath Sounds: Rhonchi Suctioning: Airway Yes, pt able to speak.  No stridor noted, [facility] clear.  No distress noted.  Jennette Kettle 12/24/2012, 6:53 PM

## 2012-12-24 NOTE — Progress Notes (Signed)
Weaning pt per SICU protocol. 

## 2012-12-25 ENCOUNTER — Inpatient Hospital Stay (HOSPITAL_COMMUNITY): Payer: Medicare Other

## 2012-12-25 LAB — GLUCOSE, CAPILLARY
Glucose-Capillary: 101 mg/dL — ABNORMAL HIGH (ref 70–99)
Glucose-Capillary: 141 mg/dL — ABNORMAL HIGH (ref 70–99)
Glucose-Capillary: 133 mg/dL — ABNORMAL HIGH (ref 70–99)

## 2012-12-25 LAB — POCT I-STAT, CHEM 8
BUN: 27 mg/dL — ABNORMAL HIGH (ref 6–23)
Chloride: 105 mEq/L (ref 96–112)
Creatinine, Ser: 1.8 mg/dL — ABNORMAL HIGH (ref 0.50–1.35)
HCT: 42 % (ref 39.0–52.0)
Hemoglobin: 14.3 g/dL (ref 13.0–17.0)
Potassium: 5.4 mEq/L — ABNORMAL HIGH (ref 3.5–5.1)
Sodium: 140 mEq/L (ref 135–145)

## 2012-12-25 LAB — BASIC METABOLIC PANEL
BUN: 15 mg/dL (ref 6–23)
CO2: 20 mEq/L (ref 19–32)
Calcium: 8.2 mg/dL — ABNORMAL LOW (ref 8.4–10.5)
Chloride: 108 mEq/L (ref 96–112)
Creatinine, Ser: 1.26 mg/dL (ref 0.50–1.35)
Glucose, Bld: 117 mg/dL — ABNORMAL HIGH (ref 70–99)
Sodium: 141 mEq/L (ref 135–145)

## 2012-12-25 LAB — POCT I-STAT 3, ART BLOOD GAS (G3+)
Acid-base deficit: 7 mmol/L — ABNORMAL HIGH (ref 0.0–2.0)
Acid-base deficit: 4 mmol/L — ABNORMAL HIGH (ref 0.0–2.0)
Bicarbonate: 20 mEq/L (ref 20.0–24.0)
O2 Saturation: 90 %
Patient temperature: 37.8
TCO2: 21 mmol/L (ref 0–100)
pCO2 arterial: 47 mmHg — ABNORMAL HIGH (ref 35.0–45.0)
pH, Arterial: 7.291 — ABNORMAL LOW (ref 7.350–7.450)
pO2, Arterial: 70 mmHg — ABNORMAL LOW (ref 80.0–100.0)
pO2, Arterial: 67 mmHg — ABNORMAL LOW (ref 80.0–100.0)

## 2012-12-25 LAB — CBC
HCT: 38.7 % — ABNORMAL LOW (ref 39.0–52.0)
HCT: 39.5 % (ref 39.0–52.0)
Hemoglobin: 13.8 g/dL (ref 13.0–17.0)
MCH: 32.9 pg (ref 26.0–34.0)
MCH: 32.3 pg (ref 26.0–34.0)
MCHC: 35.7 g/dL (ref 30.0–36.0)
MCHC: 34.4 g/dL (ref 30.0–36.0)
MCV: 92.1 fL (ref 78.0–100.0)
Platelets: 68 10*3/uL — ABNORMAL LOW (ref 150–400)
RBC: 4.2 MIL/uL — ABNORMAL LOW (ref 4.22–5.81)
RDW: 14.1 % (ref 11.5–15.5)
WBC: 22.2 10*3/uL — ABNORMAL HIGH (ref 4.0–10.5)
WBC: 22.4 10*3/uL — ABNORMAL HIGH (ref 4.0–10.5)

## 2012-12-25 LAB — CREATININE, SERUM
Creatinine, Ser: 1.69 mg/dL — ABNORMAL HIGH (ref 0.50–1.35)
GFR calc Af Amer: 51 mL/min — ABNORMAL LOW (ref >90)
GFR calc non Af Amer: 44 mL/min — ABNORMAL LOW (ref >90)

## 2012-12-25 LAB — MAGNESIUM
Magnesium: 2.6 mg/dL — ABNORMAL HIGH (ref 1.5–2.5)
Magnesium: 2.8 mg/dL — ABNORMAL HIGH (ref 1.5–2.5)

## 2012-12-25 MED ORDER — VENLAFAXINE HCL ER 75 MG PO CP24
75.0000 mg | ORAL_CAPSULE | Freq: Every day | ORAL | Status: DC
  Administered 2012-12-25 – 2012-12-30 (×6): 75 mg via ORAL
  Filled 2012-12-25 (×6): qty 1

## 2012-12-25 MED ORDER — LACTATED RINGERS IV SOLN
INTRAVENOUS | Status: DC
  Administered 2012-12-25: 13:00:00 via INTRAVENOUS

## 2012-12-25 MED ORDER — FUROSEMIDE 10 MG/ML IJ SOLN: 40.0000 mg | Freq: Once | INTRAMUSCULAR | Status: DC

## 2012-12-25 MED ORDER — GABAPENTIN 800 MG PO TABS
800.0000 mg | ORAL_TABLET | Freq: Four times a day (QID) | ORAL | Status: DC
  Filled 2012-12-25 (×4): qty 1

## 2012-12-25 MED ORDER — DEXTROSE 5 % IV SOLN
1.0000 g | Freq: Two times a day (BID) | INTRAVENOUS | Status: DC
  Administered 2012-12-25 – 2012-12-30 (×11): 1 g via INTRAVENOUS
  Filled 2012-12-25 (×15): qty 1

## 2012-12-25 MED ORDER — FUROSEMIDE 10 MG/ML IJ SOLN
20.0000 mg | Freq: Four times a day (QID) | INTRAMUSCULAR | Status: AC
  Administered 2012-12-25 – 2012-12-26 (×3): 20 mg via INTRAVENOUS
  Filled 2012-12-25 (×2): qty 2

## 2012-12-25 MED ORDER — SODIUM BICARBONATE 8.4 % IV SOLN
50.0000 meq | Freq: Once | INTRAVENOUS | Status: AC
  Administered 2012-12-25: 50 meq via INTRAVENOUS

## 2012-12-25 MED ORDER — GABAPENTIN 400 MG PO CAPS
800.0000 mg | ORAL_CAPSULE | Freq: Four times a day (QID) | ORAL | Status: DC
  Administered 2012-12-25 – 2012-12-27 (×12): 800 mg via ORAL
  Filled 2012-12-25 (×16): qty 2

## 2012-12-25 MED ORDER — INSULIN ASPART 100 UNIT/ML ~~LOC~~ SOLN: 0.0000 [IU] | SUBCUTANEOUS | Status: DC

## 2012-12-25 MED ORDER — PNEUMOCOCCAL 13-VAL CONJ VACC IM SUSP
0.5000 mL | INTRAMUSCULAR | Status: AC | PRN
  Administered 2012-12-30: 0.5 mL via INTRAMUSCULAR
  Filled 2012-12-25: qty 0.5

## 2012-12-25 MED ORDER — SODIUM CHLORIDE 0.9 % IV SOLN
INTRAVENOUS | Status: DC
  Administered 2012-12-25 – 2012-12-27 (×2): 20 mL/h via INTRAVENOUS

## 2012-12-25 MED ORDER — DOPAMINE-DEXTROSE 3.2-5 MG/ML-% IV SOLN
0.0000 ug/kg/min | INTRAVENOUS | Status: DC
  Administered 2012-12-25: 3 ug/kg/min via INTRAVENOUS
  Administered 2012-12-27: 2 ug/kg/min via INTRAVENOUS
  Filled 2012-12-25: qty 250

## 2012-12-25 MED ORDER — MORPHINE SULFATE 2 MG/ML IJ SOLN
2.0000 mg | INTRAMUSCULAR | Status: DC | PRN
  Administered 2012-12-25 – 2012-12-27 (×12): 2 mg via INTRAVENOUS
  Filled 2012-12-25 (×13): qty 1

## 2012-12-25 NOTE — Progress Notes (Signed)
RN called me to bedside d/t pt c/o SOB, pt on NRB sats 90%.  Pt was moved from chair to bed, then pt c/o nausea.  Pt coughed/gagged up large bloody mucous clot.  RN gave zofran.  Pt states his nausea is better now, pt request bipap.  Placed on bipap 60% (per prev. Settings), pt desat 83%.  Increased fio2 to 100%, sats slowly increased to 89-90%.  NO resp distress noted, pt appears comfortable.

## 2012-12-25 NOTE — Progress Notes (Signed)
Weaned fio2 to 50% in effort to wean fio2.  Sats 95-96%, RN aware.  Pt appears to be tol bipap well, no distress noted.

## 2012-12-25 NOTE — Progress Notes (Addendum)
[facility] BRIEF SICU PROGRESS NOTE  1 Day Post-Op  S/P Procedure(s) (LRB): MINIMALLY INVASIVE AORTIC VALVE REPLACEMENT (AVR) (N/A) INTRAOPERATIVE TRANSESOPHAGEAL ECHOCARDIOGRAM (N/A)   Stable day Patient reportedly feels more comfortable on BiPAP but also does fine without it Denies pain, SOB NSR w/ stable BP now on minimal dose of Neo UOP 30 mL/hr Creatinine increased 1.8  Plan: Start lasix and add renal dose dopamine.  Mobilize  [name] [name],[name] [name] 12/25/2012 5:21 PM

## 2012-12-25 NOTE — Progress Notes (Signed)
Dr Cornelius Moras updated on CBC results from ~1700. Updated pt on of dopamine, neo gtt remains off, MAP on aline 65-70. HR 90s. Pt with desat with back to bed from chair, transitioned from NRB to 100% bipap, remains on 100% bipap, o2 sat currently 93%. Pt resting comfortably. Lasix given as ordered, UOP remains marginal. Will continue to monitor. Koren Bound

## 2012-12-25 NOTE — Clinical Documentation Improvement (Signed)
THIS DOCUMENT IS NOT A PERMANENT PART OF THE MEDICAL RECORD  Please update your documentation with the medical record to reflect your response to this query. If you need help knowing how to do this please call [phone].  12/25/12  Dear Dr. Cornelius Moras Marton Redwood,  In a better effort to capture your patient's severity of illness, reflect appropriate length of stay and utilization of resources, a review of the patient medical record has revealed the following indicators.    Based on your clinical judgment, please clarify and document in a progress note and/or discharge summary the clinical condition associated with the following supporting information:  In responding to this query please exercise your independent judgment.  The fact that a query is asked, does not imply that any particular answer is desired or expected.   Possible Clinical Conditions?  Acute Respiratory Failure  Acute on Chronic Respiratory Failure  Chronic Respiratory Failure  Other Condition  Cannot Clinically Determine     Risk Factors: Started on Bi-pap @ 0430 today for 02 sat of 90-92% on 50% venturia mask noted per 10/24 Anesthesiology progress note.   Diagnostics:  ABG's     PO2: 70.0      PCO2:  47.0     PH:   7.242     BiCarb: 20.0     Acid-base deficit: 7.0     O2 sat: 89%      Date of ABG's: 12/25/12                    You may use possible, probable, or suspect with inpatient documentation. possible, probable, suspected diagnoses MUST be documented at the time of discharge  Patient started on BiPAP without clear indications for it and without any benefit from it.  At this point I don't think there are any new diagnoses to document, but I will add them as I deem appropriate   Reviewed:  no additional documentation provided  Thank You,  Marciano Sequin, Clinical Documentation Specialist: [phone] Health Information Management [place]

## 2012-12-25 NOTE — Progress Notes (Addendum)
[address]       [place] [address]             [phone]        CARDIOTHORACIC SURGERY PROGRESS NOTE   R1 Day Post-Op Procedure(s) (LRB): MINIMALLY INVASIVE AORTIC VALVE REPLACEMENT (AVR) (N/A) INTRAOPERATIVE TRANSESOPHAGEAL ECHOCARDIOGRAM (N/A)  Subjective: Expected soreness in chest.  Otherwise feels okay.  Placed on BiPAP overnight despite no SOB or respiratory distress, but currently breathing quite comfortably  Objective: Vital signs: BP Readings from Last 1 Encounters:  12/25/12 104/66   Pulse Readings from Last 1 Encounters:  12/25/12 90   Resp Readings from Last 1 Encounters:  12/25/12 23   Temp Readings from Last 1 Encounters:  12/25/12 99.5 F (37.5 C)     Hemodynamics: PAP: (30-62)/(17-39) 54/35 mmHg CO:  [3.5 L/min-5.1 L/min] 5.1 L/min CI:  [1.7 L/min/m2-2.5 L/min/m2] 2.4 L/min/m2  Physical Exam:  Rhythm:   sinus  Breath sounds: clear  Heart sounds:  RRR  Incisions:  Dressing dry, intact  Abdomen:  Soft, non-distended, non-tender  Extremities:  Warm, well-perfused    Intake/Output from previous day: 10/23 0701 - 10/24 0700 In: 5631 [P.O.:8; I.V.:3616; Blood:547; NG/GT:30; IV Piggyback:1430] Out: 3590 [Urine:2180; Blood:950; Chest Tube:460] Intake/Output this shift: Total I/O In: 120 [I.V.:70; IV Piggyback:50] Out: 30 [Urine:20; Chest Tube:10]  Lab Results:  CBC: Recent Labs  12/24/12 2000 12/24/12 2027 12/25/12 0427  WBC 14.8*  --  22.2*  HGB 12.6* 12.6* 13.8  HCT 35.6* 37.0* 38.7*  PLT 76*  --  89*    BMET:  Recent Labs  12/22/12 1444  12/24/12 2027 12/25/12 0427  NA 137  < > 141 141  K 4.3  < > 4.7 4.9  CL 103  --  108 108  CO2 21  --   --  20  GLUCOSE 97  < > 100* 117*  BUN 8  --  13 15  CREATININE 1.08  < > 1.40* 1.26  CALCIUM 9.3  --   --  8.2*  < > = values in this interval not displayed.   CBG (last 3)   Recent Labs  12/24/12 2348 12/25/12 0401 12/25/12 0720  GLUCAP 114* 101*  111*    ABG    Component Value Date/Time   PHART 7.291* 12/25/2012 0644   PCO2ART 47.8* 12/25/2012 0644   PO2ART 67.0* 12/25/2012 0644   HCO3 22.9 12/25/2012 0644   TCO2 24 12/25/2012 0644   ACIDBASEDEF 4.0* 12/25/2012 0644   O2SAT 90.0 12/25/2012 0644    CXR: CLINICAL DATA: Aortic valve disease with recent aortic valve  replacement  EXAM:  PORTABLE CHEST - 1 VIEW  COMPARISON: December 24, 2012  FINDINGS:  The endotracheal tube and nasogastric tube have been removed.  Swan-Ganz catheter tip is in the distal right main pulmonary artery,  stable. Chest tube remains the right. There is no apparent  pneumothorax.  There is an area of opacity in the right lower lobe which could  represent localized hematoma ; this opacity is located in the  approximate region of the chest tube insertion.  There is a small left effusion with patchy left base atelectasis.  Heart is upper normal in size with normal pulmonary vascularity.  IMPRESSION:  The tube and catheter positions are as described without  pneumothorax. Question hematoma right base. Atelectasis with small  effusion left base. No change in cardiac silhouette.  Electronically Signed  By: Bretta Bang M.D.  On: 12/25/2012 07:38  Assessment/Plan: S/P Procedure(s) (LRB): MINIMALLY INVASIVE AORTIC VALVE REPLACEMENT (AVR) (N/A) INTRAOPERATIVE TRANSESOPHAGEAL ECHOCARDIOGRAM (N/A)  Overall doing well POD1 Maintaining NSR w/ stable hemodynamics on low dose Neo drip Expected post op acute blood loss anemia, mild, stable Expected post op volume excess, mild Expected post op atelectasis, mild Post op thrombocytopenia, mild, improving Moderate COPD and longstanding tobacco abuse Likely active tracheobronchitis pre-op Chronic anxiety   Mobilize  D/C lines  Hold diuretics until BP stable off Neo drip  Pulm toilet  BiPAP as needed  Empiric maxepime for bronchitis  Continue nebs   OWEN,CLARENCE H 12/25/2012 9:25  AM

## 2012-12-25 NOTE — Progress Notes (Signed)
Dr Cornelius Moras updated at bedside with PM rounds. Updated pt weaned neo gtt almost off. MD updated pt OOB to chair x1 earlier in shift, will attempt OOB to chair again this shift on non rebreather. On 60% bipap vs 100% nonrebreather. Updated UOP marginal. Updated creat/K on iStat chem 8. Orders received for IV lasix and to start dopamine gtt at . Continue to wean neo gtt for aline MAP>65, cuff sbp>95. Will continue to monitor. Koren Bound

## 2012-12-25 NOTE — Progress Notes (Signed)
Pt back to bed from chair. While oob to chair, pt on 100% nonrebreather. RT to bedside to place pt back to bipap once back to bed. Pt with strong cough effort post back to bed, gagged with strong cough, nausea sensation. PRN zofran. Pt coughed up blood tinged sputum x1. Pt placed back on 100% bipap to recover from exertion with back to bed from chair. Will continue to monitor. Koren Bound

## 2012-12-25 NOTE — Progress Notes (Signed)
Anesthesiology follow up:   Subjective: in good spirits, complaining of incisional chest soreness   Labs: Lab Results  Component Value Date   WBC 22.2* 12/25/2012   HGB 13.8 12/25/2012   HCT 38.7* 12/25/2012   MCV 92.1 12/25/2012   PLT 89* 12/25/2012   Lab Results  Component Value Date   NA 141 12/25/2012   K 4.9 12/25/2012   CL 108 12/25/2012   CO2 20 12/25/2012   BUN 15 12/25/2012   CREATININE 1.26 12/25/2012   GLUCOSE 117* 12/25/2012   ABG    Component Value Date/Time   PHART 7.291* 12/25/2012 0644   PCO2ART 47.8* 12/25/2012 0644   PO2ART 67.0* 12/25/2012 0644   HCO3 22.9 12/25/2012 0644   TCO2 24 12/25/2012 0644   ACIDBASEDEF 4.0* 12/25/2012 0644   O2SAT 90.0 12/25/2012 0644    Other findings:  Physical Exam: Filed Vitals:   12/25/12 0900  BP: 98/64  Pulse: 90  Temp: 37.3 C  Resp: 17    Mental status: awake and alert, neuro intact Heart: RRR, no murmur Lungs: lungs clear CXR: mild atelectais at bases  Assessment/Plan: 52 y.o. male  S/P minimally invasive aortic valve replacement. Extubated at 18:55 yesterday, started on BIPAP at 04:30 today for O2 sat 90-92% on 50% venturi  Mask. Should be able come off BIPAP this morning  Melonie Florida, MD 12/25/2012 9:41 AM

## 2012-12-26 ENCOUNTER — Inpatient Hospital Stay (HOSPITAL_COMMUNITY): Payer: Medicare Other

## 2012-12-26 LAB — CBC
Hemoglobin: 13.6 g/dL (ref 13.0–17.0)
MCH: 33 pg (ref 26.0–34.0)
MCV: 94.2 fL (ref 78.0–100.0)
Platelets: 60 10*3/uL — ABNORMAL LOW (ref 150–400)
RBC: 4.12 MIL/uL — ABNORMAL LOW (ref 4.22–5.81)
RDW: 13.9 % (ref 11.5–15.5)
WBC: 21.4 10*3/uL — ABNORMAL HIGH (ref 4.0–10.5)

## 2012-12-26 LAB — BASIC METABOLIC PANEL
CO2: 25 mEq/L (ref 19–32)
Calcium: 8.5 mg/dL (ref 8.4–10.5)
GFR calc Af Amer: 56 mL/min — ABNORMAL LOW (ref >90)
GFR calc non Af Amer: 48 mL/min — ABNORMAL LOW (ref >90)
Glucose, Bld: 128 mg/dL — ABNORMAL HIGH (ref 70–99)
Potassium: 4.9 mEq/L (ref 3.5–5.1)
Sodium: 137 mEq/L (ref 135–145)

## 2012-12-26 LAB — GLUCOSE, CAPILLARY: Glucose-Capillary: 112 mg/dL — ABNORMAL HIGH (ref 70–99)

## 2012-12-26 MED ORDER — FUROSEMIDE 10 MG/ML IJ SOLN
40.0000 mg | Freq: Two times a day (BID) | INTRAMUSCULAR | Status: AC
  Administered 2012-12-26 – 2012-12-27 (×3): 40 mg via INTRAVENOUS
  Filled 2012-12-26 (×3): qty 4

## 2012-12-26 NOTE — Progress Notes (Signed)
TCTS BRIEF SICU PROGRESS NOTE  2 Days Post-Op  S/P Procedure(s) (LRB): MINIMALLY INVASIVE AORTIC VALVE REPLACEMENT (AVR) (N/A) INTRAOPERATIVE TRANSESOPHAGEAL ECHOCARDIOGRAM (N/A)   Stable day NSR w/ stable BP O2 requirement decreased, stable off BiPAP Diuresing some  Plan: Continue current plan  OWEN,CLARENCE H 12/26/2012 6:13 PM

## 2012-12-26 NOTE — Progress Notes (Signed)
Pt sitting up in chair, pt denies SOB, pt was on NRB, removed flaps from mask, reduced flow to 15.  Sats 97%, no distress noted.  RN aware.

## 2012-12-26 NOTE — Progress Notes (Addendum)
[address]       [place] [address]             [phone]        CARDIOTHORACIC SURGERY PROGRESS NOTE   R2 Days Post-Op Procedure(s) (LRB): MINIMALLY INVASIVE AORTIC VALVE REPLACEMENT (AVR) (N/A) INTRAOPERATIVE TRANSESOPHAGEAL ECHOCARDIOGRAM (N/A)  Subjective: Looks and feels better.  Breathing comfortably although still on BiPAP.  Soreness decreased  Objective: Vital signs: BP Readings from Last 1 Encounters:  12/26/12 125/67   Pulse Readings from Last 1 Encounters:  12/26/12 84   Resp Readings from Last 1 Encounters:  12/26/12 16   Temp Readings from Last 1 Encounters:  12/26/12 98.3 F (36.8 C) Axillary    Hemodynamics:    Physical Exam:  Rhythm:   sinus  Breath sounds: clear  Heart sounds:  RRR  Incisions:  Dressing dry, intact  Abdomen:  Soft, non-distended, non-tender  Extremities:  Warm, well-perfused    Intake/Output from previous day: 10/24 0701 - 10/25 0700 In: 895.4 [I.V.:745.4; IV Piggyback:150] Out: 1170 [Urine:1000; Chest Tube:170] Intake/Output this shift: Total I/O In: 125.6 [I.V.:75.6; IV Piggyback:50] Out: 475 [Urine:425; Chest Tube:50]  Lab Results:  CBC: Recent Labs  12/25/12 1700 12/25/12 1712 12/26/12 0355  WBC 22.4*  --  21.4*  HGB 13.6 14.3 13.6  HCT 39.5 42.0 38.8*  PLT 68*  --  60*    BMET:  Recent Labs  12/25/12 0427  12/25/12 1712 12/26/12 0355  NA 141  --  140 137  K 4.9  --  5.4* 4.9  CL 108  --  105 105  CO2 20  --   --  25  GLUCOSE 117*  --  132* 128*  BUN 15  --  27* 29*  CREATININE 1.26  < > 1.80* 1.57*  CALCIUM 8.2*  --   --  8.5  < > = values in this interval not displayed.   CBG (last 3)   Recent Labs  12/26/12 0015 12/26/12 0409 12/26/12 0827  GLUCAP 112* 93 119*    ABG    Component Value Date/Time   PHART 7.291* 12/25/2012 0644   PCO2ART 47.8* 12/25/2012 0644   PO2ART 67.0* 12/25/2012 0644   HCO3 22.9 12/25/2012 0644   TCO2 23 12/25/2012 1712   ACIDBASEDEF 4.0* 12/25/2012 0644   O2SAT 90.0 12/25/2012 0644    CXR: CLINICAL DATA: Status post aortic valve replacement surgery.  EXAM:  PORTABLE CHEST - 1 VIEW  COMPARISON: 12/25/2012  FINDINGS:  Swan-Ganz catheter has been removed. Left jugular central line  remains present with the tip in the left innominate vein. Right  chest tube present without pneumothorax. There is stable  cardiomegaly. Lungs show persistent low volumes with relatively  stable bilateral lower lobe atelectasis. No significant pleural  fluid is identified. There is no evidence of overt pulmonary edema.  IMPRESSION:  No pneumothorax. Low lung volumes with bilateral lower lobe  atelectasis.  Electronically Signed  By: Irish Lack M.D.  On: 12/26/2012 08:02    Assessment/Plan: S/P Procedure(s) (LRB): MINIMALLY INVASIVE AORTIC VALVE REPLACEMENT (AVR) (N/A) INTRAOPERATIVE TRANSESOPHAGEAL ECHOCARDIOGRAM (N/A)  Overall doing well POD2 Acute on chronic respiratory insufficiency, improved Acute renal insufficiency, likely prerenal +/- ATN, improved Expected post op acute blood loss anemia, mild, stable Expected post op volume excess, mild, diuresing Post op thrombocytopenia, essentially stable Expected post op atelectasis, mild COPD w/ longstanding tobacco abuse Likely active tracheobronchitis    Wean BiPAP and FiO2 as tolerated  Mobilize  Diuresis  Continue maxepime  Leave chest tubes until output decreased  Continue renal dopamine for now  Continue foley today to monitor UOP and diuresis  Hold ASA and watch platelet function, check HITT panel  SCD's for DVT prophylaxis, no pharmacologic prophylaxis due to thrombocytopenia and risks of bleeding  Keep in ICU until resp status improved   OWEN,CLARENCE H 12/26/2012 10:24 AM

## 2012-12-26 NOTE — Progress Notes (Signed)
Attempting to wean fio2, pt tol well, Sats 94%.  Pt states his breathing feels better.  I spoke w/ RN, we will try to wean bipap today as tolerated.

## 2012-12-27 ENCOUNTER — Inpatient Hospital Stay (HOSPITAL_COMMUNITY): Payer: Medicare Other

## 2012-12-27 LAB — BASIC METABOLIC PANEL
BUN: 34 mg/dL — ABNORMAL HIGH (ref 6–23)
CO2: 27 mEq/L (ref 19–32)
Calcium: 8.4 mg/dL (ref 8.4–10.5)
Chloride: 102 mEq/L (ref 96–112)
Creatinine, Ser: 1.32 mg/dL (ref 0.50–1.35)
GFR calc non Af Amer: 60 mL/min — ABNORMAL LOW (ref >90)
Glucose, Bld: 99 mg/dL (ref 70–99)
Potassium: 4.4 mEq/L (ref 3.5–5.1)
Sodium: 137 mEq/L (ref 135–145)

## 2012-12-27 LAB — CBC
HCT: 35.8 % — ABNORMAL LOW (ref 39.0–52.0)
Hemoglobin: 12.4 g/dL — ABNORMAL LOW (ref 13.0–17.0)
MCH: 32.6 pg (ref 26.0–34.0)
MCHC: 34.6 g/dL (ref 30.0–36.0)
RBC: 3.8 MIL/uL — ABNORMAL LOW (ref 4.22–5.81)

## 2012-12-27 MED ORDER — FUROSEMIDE 10 MG/ML IJ SOLN
40.0000 mg | Freq: Two times a day (BID) | INTRAMUSCULAR | Status: DC
  Administered 2012-12-27: 40 mg via INTRAVENOUS
  Filled 2012-12-27 (×2): qty 4

## 2012-12-27 NOTE — Progress Notes (Signed)
TCTS BRIEF SICU PROGRESS NOTE  3 Days Post-Op  S/P Procedure(s) (LRB): MINIMALLY INVASIVE AORTIC VALVE REPLACEMENT (AVR) (N/A) INTRAOPERATIVE TRANSESOPHAGEAL ECHOCARDIOGRAM (N/A)   Stable day Ambulated around SICU NSR w/ stable BP O2 sats stable on 4 L/min Diuresing fairly well  Plan: Continue current plan  Purcell Nails 12/27/2012 5:33 PM

## 2012-12-27 NOTE — Progress Notes (Addendum)
      [address]       [name] [address]             [phone]        CARDIOTHORACIC SURGERY PROGRESS NOTE   R3 Days Post-Op Procedure(s) (LRB): MINIMALLY INVASIVE AORTIC VALVE REPLACEMENT (AVR) (N/A) INTRAOPERATIVE TRANSESOPHAGEAL ECHOCARDIOGRAM (N/A)  Subjective: Looks good and feels well.  Denies SOB.  Minimal pain.    Objective: Vital signs: BP Readings from Last 1 Encounters:  12/27/12 112/66   Pulse Readings from Last 1 Encounters:  12/27/12 77   Resp Readings from Last 1 Encounters:  12/27/12 9   Temp Readings from Last 1 Encounters:  12/27/12 97.9 F (36.6 C) Oral    Hemodynamics:    Physical Exam:  Rhythm:   sinus  Breath sounds: clear  Heart sounds:  RRR  Incisions:  Clean and dry  Abdomen:  soft  Extremities:  warm   Intake/Output from previous day: 10/25 0701 - 10/26 0700 In: 1629.6 [P.O.:960; I.V.:569.6; IV Piggyback:100] Out: 2240 [Urine:1990; Chest Tube:250] Intake/Output this shift: Total I/O In: 25.2 [I.V.:25.2] Out: 200 [Urine:100; Chest Tube:100]  Lab Results:  CBC: Recent Labs  12/26/12 0355 12/27/12 0338  WBC 21.4* 13.2*  HGB 13.6 12.4*  HCT 38.8* 35.8*  PLT 60* 57*    BMET:  Recent Labs  12/26/12 0355 12/27/12 0338  NA 137 137  K 4.9 4.4  CL 105 102  CO2 25 27  GLUCOSE 128* 99  BUN 29* 34*  CREATININE 1.57* 1.32  CALCIUM 8.5 8.4     CBG (last 3)   Recent Labs  12/26/12 0409 12/26/12 0827 12/26/12 1157  GLUCAP 93 119* 87    ABG    Component Value Date/Time   PHART 7.291* 12/25/2012 0644   PCO2ART 47.8* 12/25/2012 0644   PO2ART 67.0* 12/25/2012 0644   HCO3 22.9 12/25/2012 0644   TCO2 23 12/25/2012 1712   ACIDBASEDEF 4.0* 12/25/2012 0644   O2SAT 90.0 12/25/2012 0644    CXR: CLINICAL DATA: Followup atelectasis  EXAM:  PORTABLE CHEST - 1 VIEW  COMPARISON: 12/26/2012  FINDINGS:  Bilateral chest tubes unchanged. Mild diffuse interstitial  prominence. Retrocardiac left  lower lobe consolidation persists with  mildly improved aeration at the left base. Consolidation medially in  the right lower lobe most consistent with atelectasis, unchanged. No  pneumothorax.  IMPRESSION:  Persistent bilateral lower lobe atelectasis slightly improved on the  left.  Electronically Signed  By: Esperanza Heir M.D.  On: 12/27/2012 07:20   Assessment/Plan: S/P Procedure(s) (LRB): MINIMALLY INVASIVE AORTIC VALVE REPLACEMENT (AVR) (N/A) INTRAOPERATIVE TRANSESOPHAGEAL ECHOCARDIOGRAM (N/A)  Overall stable POD3 Acute on chronic respiratory insufficiency, improved Acute renal insufficiency, likely prerenal +/- ATN, improved, creatinine approaching baseline  Expected post op acute blood loss anemia, mild, stable  Expected post op volume excess, diuresing  Post op thrombocytopenia, essentially stable  Expected post op atelectasis, mild  COPD w/ longstanding tobacco abuse  Likely active tracheobronchitis   Wean O2  Mobilize  Diuresis  Wean dopamine off  D/C tubes  Possible transfer step down if O2 requirements decreased and stable off dopamine  [name],[name] [name] 12/27/2012 10:02 AM

## 2012-12-28 ENCOUNTER — Encounter (HOSPITAL_COMMUNITY): Payer: Self-pay | Admitting: Thoracic Surgery (Cardiothoracic Vascular Surgery)

## 2012-12-28 ENCOUNTER — Inpatient Hospital Stay (HOSPITAL_COMMUNITY): Payer: Medicare Other

## 2012-12-28 LAB — BASIC METABOLIC PANEL
CO2: 29 mEq/L (ref 19–32)
Calcium: 8.1 mg/dL — ABNORMAL LOW (ref 8.4–10.5)
Creatinine, Ser: 1.2 mg/dL (ref 0.50–1.35)
GFR calc non Af Amer: 67 mL/min — ABNORMAL LOW (ref >90)
Glucose, Bld: 99 mg/dL (ref 70–99)
Sodium: 136 mEq/L (ref 135–145)

## 2012-12-28 MED ORDER — GABAPENTIN 400 MG PO CAPS
800.0000 mg | ORAL_CAPSULE | Freq: Four times a day (QID) | ORAL | Status: DC
  Administered 2012-12-28 – 2012-12-30 (×9): 800 mg via ORAL
  Filled 2012-12-28 (×12): qty 2

## 2012-12-28 MED ORDER — TRAMADOL HCL 50 MG PO TABS: 50.0000 mg | ORAL_TABLET | ORAL | Status: DC | PRN

## 2012-12-28 MED ORDER — GUAIFENESIN ER 600 MG PO TB12
600.0000 mg | ORAL_TABLET | Freq: Two times a day (BID) | ORAL | Status: DC
  Administered 2012-12-28 – 2012-12-30 (×5): 600 mg via ORAL
  Filled 2012-12-28 (×6): qty 1

## 2012-12-28 MED ORDER — SODIUM CHLORIDE 0.9 % IJ SOLN: 3.0000 mL | INTRAMUSCULAR | Status: DC | PRN

## 2012-12-28 MED ORDER — SODIUM CHLORIDE 0.9 % IJ SOLN: 3.0000 mL | Freq: Two times a day (BID) | INTRAMUSCULAR | Status: DC

## 2012-12-28 MED ORDER — POTASSIUM CHLORIDE CRYS ER 20 MEQ PO TBCR
20.0000 meq | EXTENDED_RELEASE_TABLET | Freq: Every day | ORAL | Status: AC
  Administered 2012-12-28 – 2012-12-30 (×3): 20 meq via ORAL
  Filled 2012-12-28 (×3): qty 1

## 2012-12-28 MED ORDER — MOVING RIGHT ALONG BOOK
Freq: Once | Status: DC
  Filled 2012-12-28: qty 1

## 2012-12-28 MED ORDER — FUROSEMIDE 40 MG PO TABS
40.0000 mg | ORAL_TABLET | Freq: Every day | ORAL | Status: DC
  Administered 2012-12-28 – 2012-12-30 (×3): 40 mg via ORAL
  Filled 2012-12-28 (×3): qty 1

## 2012-12-28 MED ORDER — SODIUM CHLORIDE 0.9 % IV SOLN: 250.0000 mL | INTRAVENOUS | Status: DC | PRN

## 2012-12-28 MED ORDER — ATORVASTATIN CALCIUM 20 MG PO TABS
20.0000 mg | ORAL_TABLET | Freq: Every day | ORAL | Status: DC
  Administered 2012-12-28 – 2012-12-29 (×2): 20 mg via ORAL
  Filled 2012-12-28 (×3): qty 1

## 2012-12-28 MED ORDER — POTASSIUM CHLORIDE 10 MEQ/50ML IV SOLN
10.0000 meq | INTRAVENOUS | Status: AC
  Administered 2012-12-28 (×3): 10 meq via INTRAVENOUS
  Filled 2012-12-28 (×3): qty 50

## 2012-12-28 MED ORDER — GABAPENTIN 800 MG PO TABS
800.0000 mg | ORAL_TABLET | Freq: Four times a day (QID) | ORAL | Status: DC
  Filled 2012-12-28 (×4): qty 1

## 2012-12-28 MED ORDER — ASPIRIN EC 81 MG PO TBEC
81.0000 mg | DELAYED_RELEASE_TABLET | Freq: Every day | ORAL | Status: DC
  Administered 2012-12-28 – 2012-12-30 (×3): 81 mg via ORAL
  Filled 2012-12-28 (×3): qty 1

## 2012-12-28 NOTE — Progress Notes (Addendum)
Pt arrived from 2300. Pt alert and oriented. Pt stable and resting in bed. Per report, pt has ambulated once today on [place]. Will ambulate this afternoon.

## 2012-12-28 NOTE — Progress Notes (Signed)
K+= 3.6 and creat= 1.2 w/ urine o/p > 30cc/hr; TCTS KCL protocol initiated with KCL in 50 cc x3, each over 1 hour.

## 2012-12-28 NOTE — Progress Notes (Signed)
Pt foley cath D/C'd per MD order; cattheter removed intact without difficulty after bulb deflated; Pt instructed to use urinal for future voiding, stating understanding. Pt tolerated procedure.

## 2012-12-28 NOTE — Progress Notes (Signed)
CARDIAC REHAB PHASE I   PRE:  Rate/Rhythm: 84 SR  BP:  Supine:   Sitting: 110/70  Standing:    SaO2: 95 4L  MODE:  Ambulation: 350 ft   POST:  Rate/Rhythm: 88 SR  BP:  Supine:   Sitting: 104/66  Standing:    SaO2: 94 4L 1455-1545 Assisted X 1 used walker and O2 4L to ambulate. Gait steady with walker. Slow pace, small steps. Pt able to walk 350 feet. VS stable Pt back to bed after walk per his request. Call light in reach and bed alarm on. O2 sat after walk 94% on 4L. Encouraged use of IS.  Melina Copa RN 12/28/2012 3:42 PM

## 2012-12-28 NOTE — Progress Notes (Addendum)
TCTS DAILY ICU PROGRESS NOTE                   [address]            [place] [address]          [phone]   4 Days Post-Op Procedure(s) (LRB): MINIMALLY INVASIVE AORTIC VALVE REPLACEMENT (AVR) (N/A) INTRAOPERATIVE TRANSESOPHAGEAL ECHOCARDIOGRAM (N/A)  Total Length of Stay:  LOS: 4 days   Subjective: OOB in chair.  Main complaint this am is back/neck pain related to his spinal stenosis. Still with thick secretions that are hard to cough up.   Objective: Vital signs in last 24 hours: Temp:  [97.2 F (36.2 C)-98.2 F (36.8 C)] 98.2 F (36.8 C) (10/27 0726) Pulse Rate:  [63-94] 78 (10/27 0620) Cardiac Rhythm:  [-] Normal sinus rhythm (10/26 2000) Resp:  [7-27] 15 (10/27 0620) BP: (88-119)/(54-77) 115/68 mmHg (10/27 0620) SpO2:  [90 %-97 %] 95 % (10/27 0620) Weight:  [196 lb 13.9 oz (89.3 kg)] 196 lb 13.9 oz (89.3 kg) (10/27 0400)  Filed Weights   12/26/12 0500 12/27/12 0600 12/28/12 0400  Weight: 205 lb 14.6 oz (93.4 kg) 198 lb 3.1 oz (89.9 kg) 196 lb 13.9 oz (89.3 kg)   PRE-OPERATIVE WEIGHT: 84kg  Weight change: -1 lb 5.2 oz (-0.6 kg)      Intake/Output from previous day: 10/26 0701 - 10/27 0700 In: 1568.5 [P.O.:840; I.V.:478.5; IV Piggyback:250] Out: 2400 [Urine:2300; Chest Tube:100]  Intake/Output this shift:    Current Meds: Scheduled Meds: . acetaminophen  1,000 mg Oral Q6H  . aspirin EC  81 mg Oral Daily  . atorvastatin  20 mg Oral q1800  . bisacodyl  10 mg Oral Daily   Or  . bisacodyl  10 mg Rectal Daily  . ceFEPime (MAXIPIME) IV  1 g Intravenous Q12H  . docusate sodium  200 mg Oral Daily  . furosemide  40 mg Oral Daily  . gabapentin  800 mg Oral QID  . metoprolol tartrate  12.5 mg Oral BID  . moving right along book   Does not apply Once  . pantoprazole  40 mg Oral Daily  . potassium chloride  10 mEq Intravenous Q1 Hr x 3  . potassium chloride  20 mEq Oral Daily  . sodium chloride  3 mL Intravenous Q12H  . sodium chloride  3  mL Intravenous Q12H  . venlafaxine XR  75 mg Oral Daily   Continuous Infusions: . sodium chloride    . DOPamine Stopped (12/28/12 0650)   PRN Meds:.sodium chloride, albuterol, ondansetron (ZOFRAN) IV, oxyCODONE, pneumococcal 13-valent conjugate vaccine, sodium chloride, sodium chloride, traMADol   Physical Exam: General appearance: alert, cooperative and no distress Heart: regular rate and rhythm Lungs: Diminished BS in bases, no wheezes, crackles or rhonchi Extremities: No significant LE edema Wound: Clean and dry  Lab Results: CBC: Recent Labs  12/26/12 0355 12/27/12 0338  WBC 21.4* 13.2*  HGB 13.6 12.4*  HCT 38.8* 35.8*  PLT 60* 57*   BMET:  Recent Labs  12/27/12 0338 12/28/12 0300  NA 137 136  K 4.4 3.6  CL 102 100  CO2 27 29  GLUCOSE 99 99  BUN 34* 31*  CREATININE 1.32 1.20  CALCIUM 8.4 8.1*    PT/INR: No results found for this basename: LABPROT, INR,  in the last 72 hours  Radiology:  Chest x-ray 10/27 - FINDINGS:  Postsurgical changes are again noted. A left-sided central venous  catheter and left  sided venous sheath are again identified and  stable. The right-sided chest tube has been removed in the interval.  No pneumothorax is noted. Cardiac shadow is stable. The lungs are  well aerated with evidence of small left-sided pleural effusion.  Mild interstitial changes are again identified bilaterally.  IMPRESSION:  Small left pleural effusion. The remainder of the exam is stable  with the exception of removal of a right chest tube.   Assessment/Plan: S/P Procedure(s) (LRB): MINIMALLY INVASIVE AORTIC VALVE REPLACEMENT (AVR) (N/A) INTRAOPERATIVE TRANSESOPHAGEAL ECHOCARDIOGRAM (N/A)  CV- BPs have been soft overnight, but presently 100-120 systolic and off all gtts.    Vol overload- excellent UOP yesterday.  Continue diuresis.  Pulm- probable tracheobronchitis, on Maxepime. Still with high O2 requirements.  Continue pulm toilet, will add Mucinex for  thick secretions, wean O2 as able.  ARI- Cr stable. Monitor.  Thrombocytopenia- plts down some this am, HIT panel pending. On low dose ASA.  Hypokalemia- K+ being replaced. Follow labs.  Possibly tx to stepdown later today if he remains stable.    COLLINS,GINA H 12/28/2012 7:37 AM  I have seen and examined the patient and agree with the assessment and plan as outlined.  Minimal soreness in chest.  Primary complaint is related to chronic pain in neck and back.  Transfer step down.  Mobilize.  Pulm toilet.  Wean O2 as tolerated - baseline hypoxemia present prior to surgery due to COPD and longstanding tobacco abuse.  Luretha Eberly H 12/28/2012 8:56 AM

## 2012-12-28 NOTE — Plan of Care (Signed)
Problem: Phase II Progression Outcomes Goal: Patient extubated within - Outcome: Completed/Met Date Met:  12/28/12 Extubated after 4.75 hrs

## 2012-12-29 LAB — BASIC METABOLIC PANEL
GFR calc Af Amer: >90 mL/min (ref >90)
GFR calc non Af Amer: 80 mL/min — ABNORMAL LOW (ref >90)
Potassium: 3.6 mEq/L (ref 3.5–5.1)
Sodium: 137 mEq/L (ref 135–145)

## 2012-12-29 LAB — CBC
MCHC: 35.6 g/dL (ref 30.0–36.0)
Platelets: 94 10*3/uL — ABNORMAL LOW (ref 150–400)
RDW: 13.2 % (ref 11.5–15.5)

## 2012-12-29 NOTE — Progress Notes (Addendum)
      [address]       [place] [address]             [phone]      5 Days Post-Op Procedure(s) (LRB): MINIMALLY INVASIVE AORTIC VALVE REPLACEMENT (AVR) (N/A) INTRAOPERATIVE TRANSESOPHAGEAL ECHOCARDIOGRAM (N/A)  Subjective:  Mr. [name] [name] has no new complaints.  States he feels pretty good + BM  Objective: Vital signs in last 24 hours: Temp:  [97.8 F (36.6 C)-99.1 F (37.3 C)] 98 F (36.7 C) (10/28 0443) Pulse Rate:  [68-79] 74 (10/28 0443) Cardiac Rhythm:  [-] Normal sinus rhythm (10/28 0730) Resp:  [8-18] 18 (10/28 0443) BP: (102-128)/(58-76) 110/67 mmHg (10/28 0443) SpO2:  [95 %-100 %] 100 % (10/28 0443) Weight:  [193 lb 2 oz (87.6 kg)] 193 lb 2 oz (87.6 kg) (10/28 0337)  Intake/Output from previous day: 10/27 0701 - 10/28 0700 In: 770 [P.O.:700; I.V.:20; IV Piggyback:50] Out: 1675 [Urine:1675]  General appearance: alert, cooperative and no distress Heart: regular rate and rhythm Lungs: clear to auscultation bilaterally Abdomen: soft, non-tender; bowel sounds normal; no masses,  no organomegaly Extremities: edema trace Wound: clean and dry  Lab Results:  Recent Labs  12/27/12 0338 12/29/12 0349  WBC 13.2* 10.7*  HGB 12.4* 12.9*  HCT 35.8* 36.2*  PLT 57* 94*   BMET:  Recent Labs  12/28/12 0300 12/29/12 0349  NA 136 137  K 3.6 3.6  CL 100 99  CO2 29 28  GLUCOSE 99 93  BUN 31* 19  CREATININE 1.20 1.04  CALCIUM 8.1* 8.4    PT/INR: No results found for this basename: LABPROT, INR,  in the last 72 hours ABG    Component Value Date/Time   PHART 7.291* 12/25/2012 0644   HCO3 22.9 12/25/2012 0644   TCO2 23 12/25/2012 1712   ACIDBASEDEF 4.0* 12/25/2012 0644   O2SAT 90.0 12/25/2012 0644   CBG (last 3)   Recent Labs  12/26/12 0827 12/26/12 1157  GLUCAP 119* 87    Assessment/Plan: S/P Procedure(s) (LRB): MINIMALLY INVASIVE AORTIC VALVE REPLACEMENT (AVR) (N/A) INTRAOPERATIVE TRANSESOPHAGEAL ECHOCARDIOGRAM (N/A)  1.  CV- NSR good rate and pressure control- continue Lopressor, ASA 2. Pulm- + COPD, weaning oxygen as tolerated, instructed on IS use 3. Renal- creatinine is at baseline, remains mildly volume overloaded, continue Lasix 4. ID- Tracheobronchitis- on Maxipime  5. Thrombocytopenia- improving 6. Hypokalemia- stable at 3.6 will continue supplementation 7. Dispo- patient stable, will d/c central line, wean oxygen as tolerated- possibly d/c home in the next 24-48 hrs   LOS: 5 days    BARRETT, ERIN 12/29/2012  I have seen and examined the patient and agree with the assessment and plan as outlined.  Tentatively plan d/c home tomorrow.  Lyndie Vanderloop H 12/29/2012 8:34 AM

## 2012-12-29 NOTE — Progress Notes (Signed)
CARDIAC REHAB PHASE I   PRE:  Rate/Rhythm: 72SR  BP:  Supine: 122/70  Sitting:   Standing:    SaO2: 94-97%RA  MODE:  Ambulation: 450 ft   POST:  Rate/Rhythm: 93SR  BP:  Supine: 140/70  Sitting:   Standing:    SaO2: 90%RA first 150 ft, 87%RA at 300 ft , put on 2L for 93% at 450 ft 1016-1100 Tried pt on RA and able to walk about 200 ft before desat. C/o feeling tired but not necessarily SOB with walk. Used asst x 1 and rolling walker. Put on 2L for last part of walk with sats above 90%. To bed after walk. Encouraged IS and 2 more walks.   Luetta Nutting, RN BSN  12/29/2012 10:53 AM

## 2012-12-29 NOTE — Discharge Summary (Signed)
Physician Discharge Summary       [address] [place]       [place] [address]             [phone]    Patient ID: [name] [name] MRN: [id number] DOB/AGE: [birth year] 54 y.o.  Admit date: 12/24/2012 Discharge date: 12/30/2012  Admission Diagnoses: 1. Severe aortic stenosis 2. Bicuspid aortic valve 3. History of aortic insufficiency 4. History of hypercholesterolemia 5. History of chronic low back pain 6. History of cervical DDD 7. History of tobacco abuse 8. History of alcohol abuse  Discharge Diagnoses:  1. Severe aortic stenosis 2. Bicuspid aortic valve 3. History of aortic insufficiency 4. History of hypercholesterolemia 5. History of chronic low back pain 6. History of cervical DDD 7. History of tobacco abuse 8. History of alcohol abuse 9. Thrombocytopenia  Procedure (s):  Minimally Invasive Aortic Valve Replacement Edwards Magna Ease Pericardial Tissue Valve (size 23mm, catalog #[id number], serial [id number]) Placed via Right Anterior Mini Thoracotomy Approach by Dr. Cornelius Moras on 12/24/2012.   History of Presenting Illness: This  is a 52 year old disabled white male from [facility] with bicuspid aortic valve disease including aortic stenosis and aortic insufficiency who has been referred for possible elective surgical intervention. The patient states that he was first noted to have a heart murmur on physical exam performed by his neurosurgeon in 1999. He has been followed by Dr. Hanley Hays for the last several years with serial echocardiograms which have revealed normal LV systolic function and severe aortic stenosis. Initially the patient remained asymptomatic, although recently he has noticed some changes in his exercise tolerance with mild exertional shortness of breath. He underwent a stress echocardiogram 11/19/2012. Patient had poor exercise tolerance stopping only 4 1/2 minutes into a standard Bruce protocol due to shortness of breath. Peak exercise echo findings  demonstrated that the patient's transvalvular gradient across the aortic valve reportedly increased to greater than 90 mm mercury post exercise up from baseline 60-70 mm mercury. The patient subsequently underwent transesophageal echocardiogram confirming the presence of bicuspid aortic valve with moderate to severe aortic stenosis and moderate to severe aortic regurgitation. Left ventricular size and systolic function appeared normal with ejection fraction estimated 60-65%. Patient was referred for elective surgical consultation. He was originally seen in consultation on 11/30/2012. Since then he underwent cardiac catheterization by Dr. Allyson Sabal. He was found to have moderate nonobstructive coronary artery disease. Pulmonary artery pressures were normal. The patient also underwent CT angiogram of the chest abdomen and pelvis. He was found to have several small (4 mm) pulmonary nodules in the right lung. Finally he underwent pulmonary function tests which document the presence of mild COPD. Patient returns to the office today to discuss the results of these tests and potentially schedule elective aortic valve replacement in the near future.  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 He is somewhat limited by chronic pain but overall he states that he gets around the quite well. Over the last couple of years he has appreciated significant worsening of exertional shortness of breath. He only gets short of breath with strenuous activity such as mowing the lawn. He does not get short of breath with normal activity. He has not had any chest pain or chest tightness either with activity or at rest. He denies any PND, orthopnea, or lower extremity edema. He has not had any dizzy spells or syncope. He reports no  new problems or complaints since his initial office consultation. He continues to smoke cigarettes.   Brief  Hospital Course:  The patient was extubated the evening of surgery without difficulty. He remained afebrile and hemodynamically stable. He was weaned off of Neo synephrine.Theone Murdoch and a line were removed early in the post operative course. Foley and chest tubes did remain for a few days post op. Lopressor was started and titrated accordingly. His creatinine bumped up to 1.8. He was started on renal dose Dopamine. His creatinine did normalize.He was volume over loaded and diuresed. He was intermittently placed on a non re breather and BIPAP for decreased oxygen saturation. He did have thrombocytopenia post op. His last platelet count was up to 94,000.He was weaned off the insulin drip. The patient's HGA1C pre op was 5.8. The patient was felt surgically stable for transfer from the ICU to [facility] for further convalescence on 12/28/2012. He continues to progress with cardiac rehab. He is maintaining sinus rhythm.He was ambulating on room air. He has been tolerating a diet and has had a bowel movement. Epicardial pacing wires and chest tube sutures will be removed prior to discharge. Provided the patient remains afebrile, hemodynamically stable, and pending morning round evaluation, he will be surgically stable for discharge on 12/30/2012.   Latest Vital Signs: Blood pressure 110/67, pulse 74, temperature 98 F (36.7 C), temperature source Oral, resp. rate 18, height 6' (1.829 m), weight 87.6 kg (193 lb 2 oz), SpO2 100.00%.  Physical Exam: General appearance: alert, cooperative and no distress  Heart: regular rate and rhythm  Lungs: clear to auscultation bilaterally  Abdomen: soft, non-tender; bowel sounds normal; no masses, no organomegaly  Extremities: edema trace  Wound: clean and dry   Discharge Condition:Stable  Recent laboratory studies:  Lab Results  Component Value Date   WBC 10.7* 12/29/2012   HGB 12.9* 12/29/2012   HCT 36.2* 12/29/2012   MCV 91.6 12/29/2012   PLT 94* 12/29/2012   Lab  Results  Component Value Date   NA 137 12/29/2012   K 3.6 12/29/2012   CL 99 12/29/2012   CO2 28 12/29/2012   CREATININE 1.04 12/29/2012   GLUCOSE 93 12/29/2012      Diagnostic Studies: Dg Chest 2 View  12/28/2012   CLINICAL DATA:  Chest pain  EXAM: CHEST  2 VIEW  COMPARISON:  12/27/2012  FINDINGS: Postsurgical changes are again noted. A left-sided central venous catheter and left sided venous sheath are again identified and stable. The right-sided chest tube has been removed in the interval. No pneumothorax is noted. Cardiac shadow is stable. The lungs are well aerated with evidence of small left-sided pleural effusion. Mild interstitial changes are again identified bilaterally.  IMPRESSION: Small left pleural effusion. The remainder of the exam is stable with the exception of removal of a right chest tube.   Electronically Signed   By: Alcide Clever M.D.   On: 12/28/2012 07:39    Ct Angio Chest Aorta W/cm &/or Wo/cm  12/08/2012   CLINICAL DATA:  Cough and short of breath  EXAM: CT ANGIOGRAPHY CHEST, ABDOMEN AND PELVIS  TECHNIQUE: Multidetector CT imaging through the chest, abdomen and pelvis was performed using the standard protocol during bolus administration of intravenous contrast. Multiplanar reconstructed images including MIPs were obtained and reviewed to evaluate the vascular anatomy.  CONTRAST:  OMNIPAQUE IOHEXOL 350 MG/ML SOLN  COMPARISON:  None.  FINDINGS: CTA CHEST FINDINGS  Pre contrast images demonstrate no evidence of intramural hematoma. There is  pronounced calcification involving all leaflets of the aortic valve. Mild 3 vessel coronary artery calcifications. Minimal mitral annular calcification.  Aortic diameters at the sinus of Valsalva, sino-tubular junction, and ascending aorta are 3.5 cm, 2.7 cm, and 3.7 cm respectively. No evidence of aneurysm or dissection.  Innominate artery, right subclavian artery, right common carotid artery, left common carotid artery, left  subclavian artery are widely patent. Significant narrowing at the origin of the left vertebral artery is suggested. The right vertebral artery is patent.  No obvious filling defects in the pulmonary arterial tree to suggest acute pulmonary thromboembolism.  Enlargement of the left ventricle with left ventricular muscular hypertrophy is noted.  No pericardial effusion. No abnormal adenopathy by measurement criteria.  No pneumothorax. No pleural effusion.  3 mm right upper lobe pulmonary nodule on image 57. 4 mm right upper lobe nodule on image 67. 5 mm visceral pleural node on the left on image 60.  No acute bony deformity.  Heterogeneous 7 mm right thyroid nodule on image 2.  Review of the MIP images confirms the above findings.  CTA ABDOMEN AND PELVIS FINDINGS  The aorta is non aneurysmal and patent. Little if any plaque in the suprarenal are juxtarenal aorta. There is irregular circumferential plaque in the infrarenal aorta without significant narrowing.  Celiac axis is patent.  Branch vessels are patent.  SMA is patent.  Branch vessels are patent.  A single right renal artery and 2 left renal arteries are patent.  There is cortical narrowing at the origin of the IMA. Branch vessels are patent.  Atherosclerotic changes of the right common iliac artery without significant narrowing. It is mildly ectatic at 10 mm. Right internal and external iliac arteries are patent. Right external iliac artery is ectatic at 10 mm.  There is irregular plaque within the left common iliac artery without significant narrowing. It is aneurysmal measuring 17 mm in caliber. Left external iliac artery is patent with some plaque at its origin. There is significant narrowing at the origin of the left internal iliac artery with post stenotic dilatation measuring up to 10 mm.  Proximal femoral vasculature is patent.  The liver, gallbladder, spleen, adrenal glands, kidneys are within normal limits.  Normal appendix. Minimal diverticulosis of  the sigmoid colon without acute diverticulitis  Mild bladder wall thickening is nonspecific. Unremarkable prostate.  Mild mesenteric adenopathy is present. Several small bowel mesenteric lymph nodes are visualized. The largest is 13 mm in short axis diameter. 10 mm right obturator node on image 273. Small para-aortic nodes.  Postoperative changes in the lumbar spine are noted. Severe degenerative disc disease and endplate changes at L2-3. L4-5 fusion. No vertebral compression deformity.  Review of the MIP images confirms the above findings.  IMPRESSION: CTA CHEST IMPRESSION  No evidence of aortic aneurysm or dissection.  Significant narrowing at the origin of the vertebral artery is suggested.  4 mm right upper lobe pulmonary nodules. If the patient is at high risk for bronchogenic carcinoma, follow-up chest CT at 1year is recommended. If the patient is at low risk, no follow-up is needed. This recommendation follows the consensus statement: Guidelines for Management of Small Pulmonary Nodules Detected on CT Scans: A Statement from the Fleischner Society as published in Radiology 2005; 237:395-400.  Extensive aortic valve calcification and left ventricular hypertrophy consistent with a history of aortic stenosis.  7 mm right thyroid nodule.  CTA ABDOMEN AND PELVIS IMPRESSION  No evidence of aorto occlusive disease or iliac artery narrowing to results  and arterial insufficiency.  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  Mild abnormal adenopathy in the small bowel mesentery. The lymphoproliferative process cannot be excluded. PET-CT may be helpful as clinically indicated. At a minimum, 3-6 month followup is recommended to ensure stability.   Electronically Signed   By: Maryclare Bean M.D.   On: 12/08/2012 13:25   Ct Angio Abd/pel W/ And/or W/o  12/08/2012   CLINICAL DATA:  Cough and short of breath  EXAM: CT ANGIOGRAPHY CHEST, ABDOMEN AND PELVIS   TECHNIQUE: Multidetector CT imaging through the chest, abdomen and pelvis was performed using the standard protocol during bolus administration of intravenous contrast. Multiplanar reconstructed images including MIPs were obtained and reviewed to evaluate the vascular anatomy.  CONTRAST:  OMNIPAQUE IOHEXOL 350 MG/ML SOLN  COMPARISON:  None.  FINDINGS: CTA CHEST FINDINGS  Pre contrast images demonstrate no evidence of intramural hematoma. There is pronounced calcification involving all leaflets of the aortic valve. Mild 3 vessel coronary artery calcifications. Minimal mitral annular calcification.  Aortic diameters at the sinus of Valsalva, sino-tubular junction, and ascending aorta are 3.5 cm, 2.7 cm, and 3.7 cm respectively. No evidence of aneurysm or dissection.  Innominate artery, right subclavian artery, right common carotid artery, left common carotid artery, left subclavian artery are widely patent. Significant narrowing at the origin of the left vertebral artery is suggested. The right vertebral artery is patent.  No obvious filling defects in the pulmonary arterial tree to suggest acute pulmonary thromboembolism.  Enlargement of the left ventricle with left ventricular muscular hypertrophy is noted.  No pericardial effusion. No abnormal adenopathy by measurement criteria.  No pneumothorax. No pleural effusion.  3 mm right upper lobe pulmonary nodule on image 57. 4 mm right upper lobe nodule on image 67. 5 mm visceral pleural node on the left on image 60.  No acute bony deformity.  Heterogeneous 7 mm right thyroid nodule on image 2.  Review of the MIP images confirms the above findings.  CTA ABDOMEN AND PELVIS FINDINGS  The aorta is non aneurysmal and patent. Little if any plaque in the suprarenal are juxtarenal aorta. There is irregular circumferential plaque in the infrarenal aorta without significant narrowing.  Celiac axis is patent.  Branch vessels are patent.  SMA is patent.  Branch vessels are  patent.  A single right renal artery and 2 left renal arteries are patent.  There is cortical narrowing at the origin of the IMA. Branch vessels are patent.  Atherosclerotic changes of the right common iliac artery without significant narrowing. It is mildly ectatic at 10 mm. Right internal and external iliac arteries are patent. Right external iliac artery is ectatic at 10 mm.  There is irregular plaque within the left common iliac artery without significant narrowing. It is aneurysmal measuring 17 mm in caliber. Left external iliac artery is patent with some plaque at its origin. There is significant narrowing at the origin of the left internal iliac artery with post stenotic dilatation measuring up to 10 mm.  Proximal femoral vasculature is patent.  The liver, gallbladder, spleen, adrenal glands, kidneys are within normal limits.  Normal appendix. Minimal diverticulosis of the sigmoid colon without acute diverticulitis  Mild bladder wall thickening is nonspecific. Unremarkable prostate.  Mild mesenteric adenopathy is present. Several small bowel mesenteric lymph nodes are visualized. The largest is 13 mm in short axis diameter. 10 mm right obturator node on image 273. Small para-aortic  nodes.  Postoperative changes in the lumbar spine are noted. Severe degenerative disc disease and endplate changes at L2-3. L4-5 fusion. No vertebral compression deformity.  Review of the MIP images confirms the above findings.  IMPRESSION: CTA CHEST IMPRESSION  No evidence of aortic aneurysm or dissection.  Significant narrowing at the origin of the vertebral artery is suggested.  4 mm right upper lobe pulmonary nodules. If the patient is at high risk for bronchogenic carcinoma, follow-up chest CT at 1year is recommended. If the patient is at low risk, no follow-up is needed. This recommendation follows the consensus statement: Guidelines for Management of Small Pulmonary Nodules Detected on CT Scans: A Statement from the  Fleischner Society as published in Radiology 2005; 237:395-400.  Extensive aortic valve calcification and left ventricular hypertrophy consistent with a history of aortic stenosis.  7 mm right thyroid nodule.  CTA ABDOMEN AND PELVIS IMPRESSION  No evidence of aorto occlusive disease or iliac artery narrowing to results and arterial insufficiency.  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  Mild abnormal adenopathy in the small bowel mesentery. The lymphoproliferative process cannot be excluded. PET-CT may be helpful as clinically indicated. At a minimum, 3-6 month followup is recommended to ensure stability.   Electronically Signed   By: Maryclare Bean M.D.   On: 12/08/2012 13:25        Future Appointments Provider Department Dept Phone   01/18/2013 9:30 AM Purcell Nails, MD [facility] [phone]      Discharge Medications:    Medication List         aspirin EC 81 MG tablet  Take 81 mg by mouth daily.     furosemide 40 MG tablet  Commonly known as:  LASIX  Take 1 tablet (40 mg total) by mouth daily. For 5 Days     gabapentin 800 MG tablet  Commonly known as:  NEURONTIN  Take 800 mg by mouth 4 (four) times daily.     guaiFENesin 600 MG 12 hr tablet  Commonly known as:  MUCINEX  Take 1 tablet (600 mg total) by mouth 2 (two) times daily as needed for congestion.     metoprolol tartrate 25 MG tablet  Commonly known as:  LOPRESSOR  Take 0.5 tablets (12.5 mg total) by mouth 2 (two) times daily.     oxyCODONE 5 MG immediate release tablet  Commonly known as:  Oxy IR/ROXICODONE  Take 1-2 tablets (5-10 mg total) by mouth every 3 (three) hours as needed.     potassium chloride SA 20 MEQ tablet  Commonly known as:  K-DUR,KLOR-CON  Take 1 tablet (20 mEq total) by mouth daily. For 5 Days     rosuvastatin 10 MG tablet  Commonly known as:  CRESTOR  Take 10 mg by mouth daily.       venlafaxine XR 75 MG 24 hr capsule  Commonly known as:  EFFEXOR-XR  Take 75 mg by mouth daily.       The patient has been discharged on:   1.Beta Blocker:  Yes [ x  ]                              No   [   ]  If No, reason:  2.Ace Inhibitor/ARB: Yes [   ]                                     No  [  x  ]                                     If No, reason: Labile Blood Pressure  3.Statin:   Yes [x   ]                  No  [   ]                  If No, reason:  4.Ecasa:  Yes  [ x  ]                  No   [   ]                  If No, reason:      Follow Up Appointments:     Follow-up Information   Follow up with Runell Gess, MD. (Call for a follow up appointment for 2 weeks)    Specialty:  Cardiology   Contact information:   [address] [place] [address] [phone]       Follow up with Purcell Nails, MD. (PA/LAT CXR to be taken (at [facility] which is in the same building as Dr. Orvan July office) on 01/18/2013 at 8:30 am;Appointment with Dr. Cornelius Moras is on 01/18/2013 at 9:30 am)    Specialty:  Cardiothoracic Surgery   Contact information:   [address] [place] [address] [phone]       Signed: Doree Fudge MPA-C 12/29/2012, 9:10 AM

## 2012-12-29 NOTE — Progress Notes (Signed)
Weaned pt to room air- O2 sats 94% on room air at rest. Will continue to monitor and see how he ambulates without O2 [place].

## 2012-12-29 NOTE — Progress Notes (Signed)
Pt ambulated approx. 400 feet with rolling walker and on 2L [place]. Pt tolerated well.

## 2012-12-29 NOTE — Progress Notes (Signed)
Removed pt's central line per order. Removed sutures holding in central line, placed pt in trendelenburg position, and removed line. Pt tolerated well. End of line was intact. Placed occlusive dressing over site and held pressure for 5 minutes. Pt on bed rest for 30 minutes. Pt did not have EPW to be removed. Redressed pt's chest tube sutures and cleansed site.

## 2012-12-30 ENCOUNTER — Other Ambulatory Visit: Payer: Self-pay | Admitting: Physician Assistant

## 2012-12-30 MED ORDER — OXYCODONE HCL 5 MG PO TABS: 5.0000 mg | ORAL_TABLET | ORAL | Status: DC | PRN

## 2012-12-30 MED ORDER — METOPROLOL TARTRATE 25 MG PO TABS: 12.5000 mg | ORAL_TABLET | Freq: Two times a day (BID) | ORAL | Status: AC

## 2012-12-30 MED ORDER — GUAIFENESIN ER 600 MG PO TB12: 600.0000 mg | ORAL_TABLET | Freq: Two times a day (BID) | ORAL | Status: DC | PRN

## 2012-12-30 MED ORDER — POTASSIUM CHLORIDE CRYS ER 20 MEQ PO TBCR: 20.0000 meq | EXTENDED_RELEASE_TABLET | Freq: Every day | ORAL | Status: DC

## 2012-12-30 MED ORDER — FUROSEMIDE 40 MG PO TABS: 40.0000 mg | ORAL_TABLET | Freq: Every day | ORAL | Status: DC

## 2012-12-30 NOTE — Progress Notes (Signed)
Removed chest tube sutures per order. Placed benzoin and steri strips. Discussed discharge instructions and gave new prescriptions to pt and his wife. Pt verbalized his understanding of this. Pt is stable for discharge with his wife.

## 2012-12-30 NOTE — Progress Notes (Signed)
CARDIAC REHAB PHASE I   PRE:  Rate/Rhythm: 90/54    BP: sitting 90/54    SaO2: 89 RA  MODE:  Ambulation: 550 ft   POST:  Rate/Rhythm: 91 SR    BP: sitting 120/64     SaO2: 90-91 2L  SATURATION QUALIFICATIONS: (This note is used to comply with regulatory documentation for home oxygen)  Patient Saturations on Room Air at Rest = 89%  Patient Saturations on Room Air while Ambulating = 85%  Patient Saturations on 2 Liters of oxygen while Ambulating = 93%  Please briefly explain why patient needs home oxygen: Pt SaO2 borderline on RA in room. Desaturated to 85 RA walking. Pt denied SOB. SaO2 up with 2L O2. Pt did not need RW and does not need one for home. Declined HHRN to check O2. Ed completed and pt requests his name be sent to [facility].  [phone]  Harriet Masson CES, ACSM 12/30/2012 9:18 AM     97 SR

## 2012-12-30 NOTE — Care Management Note (Signed)
    Page 1 of 1   12/30/2012     4:49:43 PM   CARE MANAGEMENT NOTE 12/30/2012  Patient:  [name],[name]   Account Number:  [id number]  Date Initiated:  12/28/2012  Documentation initiated by:  Avie Arenas  Subjective/Objective Assessment:   Post op minimally invasive AVR     Action/Plan:   Anticipated DC Date:  12/30/2012   Anticipated DC Plan:  HOME W HOME HEALTH SERVICES      DC Planning Services  CM consult      Choice offered to / List presented to:     DME arranged  [facility]      DME agency  [facility]        Status of service:  Completed, signed off Medicare Important Message given?   (If response is "NO", the following Medicare IM given date fields will be blank) Date Medicare IM given:   Date Additional Medicare IM given:    Discharge Disposition:  HOME/SELF CARE  Per UR Regulation:  Reviewed for med. necessity/level of care/duration of stay  If discussed at Long Length of Stay Meetings, dates discussed:    Comments:  ContactElior, Robinette [phone]   [phone]   12/30/12 Azayla Polo,RN,BSN [phone] PT REQUIRES HOME O2, AS CONT TO DESAT WITH ACTIVITY. REFERRAL TO [facility] FOR HOME OXYGEN SET UP.  PORTABLE O2 TANK DELIVERED TO PT ROOM PRIOR TO DC.

## 2012-12-30 NOTE — Progress Notes (Signed)
Ambulated 150 feet using rolling walker on 2LPM of O2 tolerated well

## 2012-12-30 NOTE — Progress Notes (Addendum)
      [address]       [place] [address]             [phone]      6 Days Post-Op Procedure(s) (LRB): MINIMALLY INVASIVE AORTIC VALVE REPLACEMENT (AVR) (N/A) INTRAOPERATIVE TRANSESOPHAGEAL ECHOCARDIOGRAM (N/A)  Subjective:  Mr. Cervenka has no complaints this morning.  He states he is feeling pretty good and would like to go home today.  Objective: Vital signs in last 24 hours: Temp:  [98.3 F (36.8 C)-98.7 F (37.1 C)] 98.3 F (36.8 C) (10/29 0426) Pulse Rate:  [67-85] 84 (10/29 0426) Cardiac Rhythm:  [-] Normal sinus rhythm (10/28 1950) Resp:  [18] 18 (10/29 0426) BP: (106-124)/(56-70) 111/61 mmHg (10/29 0426) SpO2:  [91 %-98 %] 96 % (10/29 0426) Weight:  [191 lb 5.8 oz (86.8 kg)] 191 lb 5.8 oz (86.8 kg) (10/29 0426)   Intake/Output from previous day: 10/28 0701 - 10/29 0700 In: 360 [P.O.:360] Out: 400 [Urine:400] Intake/Output this shift: Total I/O In: 240 [P.O.:240] Out: -   General appearance: alert and cooperative Heart: regular rate and rhythm Lungs: clear to auscultation bilaterally Abdomen: soft, non-tender; bowel sounds normal; no masses,  no organomegaly Extremities: edema trace Wound: clean and dry  Lab Results:  Recent Labs  12/29/12 0349  WBC 10.7*  HGB 12.9*  HCT 36.2*  PLT 94*   BMET:  Recent Labs  12/28/12 0300 12/29/12 0349  NA 136 137  K 3.6 3.6  CL 100 99  CO2 29 28  GLUCOSE 99 93  BUN 31* 19  CREATININE 1.20 1.04  CALCIUM 8.1* 8.4    PT/INR: No results found for this basename: LABPROT, INR,  in the last 72 hours ABG    Component Value Date/Time   PHART 7.291* 12/25/2012 0644   HCO3 22.9 12/25/2012 0644   TCO2 23 12/25/2012 1712   ACIDBASEDEF 4.0* 12/25/2012 0644   O2SAT 90.0 12/25/2012 0644   CBG (last 3)  No results found for this basename: GLUCAP,  in the last 72 hours  Assessment/Plan: S/P Procedure(s) (LRB): MINIMALLY INVASIVE AORTIC VALVE REPLACEMENT (AVR) (N/A) INTRAOPERATIVE  TRANSESOPHAGEAL ECHOCARDIOGRAM (N/A)  1. CV- NSR good rate and pressure control- continue Lopressor, ASA 2. Pulm- + COPD, off oxygen, encouraged IS 3. Renal- weight remains mildly elevated, will continue lasix at discharge 4. ID- Tracheobronchitis- resolved, will discontinue Maxipime 5. Dispo- patient is stable, will d/c home today   LOS: 6 days    BARRETT, ERIN 12/30/2012  I have seen and examined the patient and agree with the assessment and plan as outlined.  Annalisse Minkoff H 12/30/2012 9:07 AM

## 2013-01-14 ENCOUNTER — Other Ambulatory Visit: Payer: Self-pay | Admitting: *Deleted

## 2013-01-15 ENCOUNTER — Other Ambulatory Visit: Payer: Self-pay | Admitting: *Deleted

## 2013-01-15 ENCOUNTER — Ambulatory Visit
Admission: RE | Admit: 2013-01-15 | Discharge: 2013-01-15 | Disposition: A | Discharge status: Home / Self Care | Payer: Medicare Other | Source: Ambulatory Visit | Attending: Thoracic Surgery (Cardiothoracic Vascular Surgery) | Admitting: Thoracic Surgery (Cardiothoracic Vascular Surgery)

## 2013-01-15 ENCOUNTER — Encounter: Payer: Self-pay | Admitting: Thoracic Surgery (Cardiothoracic Vascular Surgery)

## 2013-01-15 ENCOUNTER — Ambulatory Visit (INDEPENDENT_AMBULATORY_CARE_PROVIDER_SITE_OTHER): Payer: Self-pay | Admitting: Thoracic Surgery (Cardiothoracic Vascular Surgery)

## 2013-01-15 VITALS — BP 130/86 | HR 70 | Resp 20 | Ht 72.0 in | Wt 182.0 lb

## 2013-01-15 DIAGNOSIS — I350 Nonrheumatic aortic (valve) stenosis: Secondary | ICD-10-CM

## 2013-01-15 DIAGNOSIS — Z953 Presence of xenogenic heart valve: Secondary | ICD-10-CM

## 2013-01-15 DIAGNOSIS — Z952 Presence of prosthetic heart valve: Secondary | ICD-10-CM

## 2013-01-15 DIAGNOSIS — I359 Nonrheumatic aortic valve disorder, unspecified: Secondary | ICD-10-CM

## 2013-01-15 DIAGNOSIS — Z954 Presence of other heart-valve replacement: Secondary | ICD-10-CM

## 2013-01-15 MED ORDER — IBUPROFEN 800 MG PO TABS: 800.0000 mg | ORAL_TABLET | Freq: Two times a day (BID) | ORAL | Status: AC | PRN

## 2013-01-15 MED ORDER — IBUPROFEN 800 MG PO TABS: 800.0000 mg | ORAL_TABLET | Freq: Two times a day (BID) | ORAL | Status: DC | PRN

## 2013-01-15 NOTE — Patient Instructions (Signed)
The patient may continue to gradually increase their physical activity as tolerated.  They should refrain from any heavy lifting or strenuous use of their arms and shoulders until at least 8 weeks from the time of their surgery, and they should avoid activities that cause increased pain in their chest on the side of their surgical incision.  Otherwise they may continue to increase their activities without any particular limitations.  The patient may return to driving an automobile as long as they are no longer requiring oral narcotic pain relievers during the daytime.  It would be wise to start driving only short distances during the daylight and gradually increase from there as they feel comfortable.  The patient is encouraged to enroll and participate in the outpatient cardiac rehab program beginning as soon as practical.  

## 2013-01-15 NOTE — Progress Notes (Signed)
[address]       [name] [address]             [phone]     CARDIOTHORACIC SURGERY OFFICE NOTE  Referring Provider is Sherrill Raring, MD PCP is Caffie Damme, MD   HPI:  Patient returns for routine followup status post minimally invasive aortic valve replacement using a bioprosthetic tissue valve on 12/24/2012. His postoperative recovery has been entirely uncomplicated. The patient does have long-standing history of tobacco abuse and COPD, and for this he was discharged from the hospital was for oxygen therapy. However, the patient reports that shortly after going home he stopped using oxygen therapy. He is continues to exceptionally well since then. He was seen in followup by Dr. Hanley Hays and he returns to our office for routine followup today. He has very mild residual soreness in his chest. He is no longer taking any sort of pain relievers other than ibuprofen. He has no shortness of breath. He has no cough. His appetite is good. He sleeping well at night. His exercise tolerance is increasing nicely. He plans to start the cardiac rehabilitation program soon.  He has not been smoking.   Current Outpatient Prescriptions  Medication Sig Dispense Refill  . aspirin EC 81 MG tablet Take 81 mg by mouth daily.      Marland Kitchen gabapentin (NEURONTIN) 800 MG tablet Take 800 mg by mouth 4 (four) times daily.       Marland Kitchen guaiFENesin (MUCINEX) 600 MG 12 hr tablet Take 1 tablet (600 mg total) by mouth 2 (two) times daily as needed for congestion.      Marland Kitchen ibuprofen (ADVIL,MOTRIN) 800 MG tablet Take 1 tablet (800 mg total) by mouth every 12 (twelve) hours as needed for moderate pain.  30 tablet  2  . metoprolol tartrate (LOPRESSOR) 25 MG tablet Take 0.5 tablets (12.5 mg total) by mouth 2 (two) times daily.  30 tablet  3  . rosuvastatin (CRESTOR) 10 MG tablet Take 10 mg by mouth daily.      Marland Kitchen venlafaxine XR (EFFEXOR-XR) 75 MG 24 hr capsule Take 75 mg by mouth daily.       No current  facility-administered medications for this visit.      Physical Exam:   BP 130/86  Pulse 70  Resp 20  Ht 6' (1.829 m)  Wt 182 lb (82.555 kg)  BMI 24.68 kg/m2  SpO2 97%  General:  Well-appearing  Chest:   Clear to auscultation  CV:   Regular rate and rhythm with soft systolic murmur heard along sternal border  Incisions:  Clean and dry and healing nicely  Abdomen:  Soft and nontender  Extremities:  Warm and well-perfused  Diagnostic Tests:  CLINICAL DATA: Status post AVR, no chest pain.  EXAM:  CHEST 2 VIEW  COMPARISON: 12/28/2012.  FINDINGS:  The heart size and mediastinal contours are within normal limits.  There is evidence of prior aortic valve replacement. There is a  sternal plate present. There is mild right midlung scarring. There  is no focal consolidation, pleural effusion or pneumothorax. The  visualized skeletal structures are unremarkable.  IMPRESSION:  No active cardiopulmonary disease.  Electronically Signed  By: Elige Ko  On: 01/15/2013 11:42    Impression:  Patient is doing very well just 3 weeks following minimally invasive aortic valve replacement   Plan:  I've encouraged patient to continue to increase his physical activity as tolerated with his only significant limitations remaining  that he refrain from heavy lifting or strenuous use of his arms or shoulders for least another 4 weeks. I think it is reasonable for him to resume driving an automobile. I have encouraged him to begin the cardiac rehabilitation program. Have reminded him how important it will be for him to continue to abstain from tobacco use. All of his questions been addressed. We will plan to see him back in 3 months and review results of followup echocardiogram that has been planned by Dr. Hanley Hays.   [name]. Cornelius Moras, MD 01/15/2013 12:25 PM

## 2013-01-18 ENCOUNTER — Ambulatory Visit: Payer: Self-pay | Admitting: Thoracic Surgery (Cardiothoracic Vascular Surgery)

## 2013-04-19 ENCOUNTER — Ambulatory Visit: Payer: Medicare HMO | Admitting: Thoracic Surgery (Cardiothoracic Vascular Surgery)

## 2013-05-03 ENCOUNTER — Ambulatory Visit (INDEPENDENT_AMBULATORY_CARE_PROVIDER_SITE_OTHER): Payer: Medicare HMO | Admitting: Thoracic Surgery (Cardiothoracic Vascular Surgery)

## 2013-05-03 ENCOUNTER — Encounter: Payer: Self-pay | Admitting: Thoracic Surgery (Cardiothoracic Vascular Surgery)

## 2013-05-03 VITALS — BP 111/74 | HR 79 | Resp 20 | Ht 72.0 in | Wt 182.0 lb

## 2013-05-03 DIAGNOSIS — I359 Nonrheumatic aortic valve disorder, unspecified: Secondary | ICD-10-CM

## 2013-05-03 DIAGNOSIS — Z952 Presence of prosthetic heart valve: Secondary | ICD-10-CM

## 2013-05-03 DIAGNOSIS — I350 Nonrheumatic aortic (valve) stenosis: Secondary | ICD-10-CM

## 2013-05-03 DIAGNOSIS — Z953 Presence of xenogenic heart valve: Secondary | ICD-10-CM

## 2013-05-03 NOTE — Patient Instructions (Signed)
Patient may resume normal physical activity without any particular limitations at this time, and return to our office only as needed should any further problems or questions arise.   Endocarditis is a potentially serious infection of heart valves or inside lining of the heart.  It occurs more commonly in patients with diseased heart valves (such as patient's with aortic or mitral valve disease) and in patients who have undergone heart valve repair or replacement.  Certain surgical and dental procedures may put you at risk, such as dental cleaning, other dental procedures, or any surgery involving the respiratory, urinary, gastrointestinal tract, gallbladder or prostate gland.   To minimize your chances for develooping endocarditis, maintain good oral health and seek prompt medical attention for any infections involving the mouth, teeth, gums, skin or urinary tract.  Always notify your doctor or dentist about your underlying heart valve condition before having any invasive procedures. You will need to take antibiotics before certain procedures.        

## 2013-05-03 NOTE — Progress Notes (Signed)
[address]       [name] [address]             [phone]     CARDIOTHORACIC SURGERY OFFICE NOTE  Referring Provider is Sherrill Raring, MD PCP is Caffie Damme, MD   HPI:  Patient returns for routine followup status post minimally invasive aortic valve replacement using a bioprosthetic tissue valve on 12/24/2012. His postoperative recovery was uncomplicated he was last seen here for followup on 01/15/2013. Since then he has continued to do very well. He underwent followup transthoracic and transesophageal echocardiograms in January. Initially there was apparently some question about a possible aneurysm noted on his transthoracic echocardiogram, the transesophageal echocardiogram looked completely normal. The patient suffered a syncopal episode the evening after his transesophageal echocardiogram was performed and was hospitalized briefly, although he was told that there did not appear to be anything wrong and his syncopal event was most likely related to the anesthesia he had at the time of his TEE. He has otherwise done quite well. He continues to participate in outpatient cardiac rehabilitation program. He is not smoking. His exercise tolerance is gradually improved and he states that he is notably better than he was prior to surgery. However, he still feels like he gets tired more easily than he should and he is hopeful that we'll continue to improve. He's not having any exertional chest pain or shortness of breath. Overall he is getting along quite well.    Current Outpatient Prescriptions  Medication Sig Dispense Refill  . aspirin EC 81 MG tablet Take 81 mg by mouth daily.      Marland Kitchen gabapentin (NEURONTIN) 800 MG tablet Take 800 mg by mouth 4 (four) times daily.       Marland Kitchen guaiFENesin (MUCINEX) 600 MG 12 hr tablet Take 1 tablet (600 mg total) by mouth 2 (two) times daily as needed for congestion.      Marland Kitchen ibuprofen (ADVIL,MOTRIN) 800 MG tablet Take 1 tablet (800 mg  total) by mouth every 12 (twelve) hours as needed for moderate pain.  30 tablet  2  . metoprolol tartrate (LOPRESSOR) 25 MG tablet Take 0.5 tablets (12.5 mg total) by mouth 2 (two) times daily.  30 tablet  3  . rosuvastatin (CRESTOR) 10 MG tablet Take 10 mg by mouth daily.      Marland Kitchen venlafaxine XR (EFFEXOR-XR) 75 MG 24 hr capsule Take 75 mg by mouth daily.       No current facility-administered medications for this visit.      Physical Exam:   BP 111/74  Pulse 79  Resp 20  Ht 6' (1.829 m)  Wt 182 lb (82.555 kg)  BMI 24.68 kg/m2  SpO2 97%  General:  Well-appearing  Chest:   Clear to auscultation  CV:   Regular rate and rhythm with grade 2/6 systolic murmur heard best at the right upper sternal border  Incisions:  Completely healed  Abdomen:  Soft and nontender  Extremities:  Warm and well-perfused  Diagnostic Tests:  Both the reports and images from transthoracic and transesophageal echocardiograms performed in January 2015 are reviewed.  The patient has normal left ventricular size and systolic function with ejection fraction estimated 55-60%. There is a normal functioning bioprosthetic tissue valve in the aortic position. Peak velocity across the aortic valve measured 2.6 m/s corresponding to a mean transvalvular gradient of 16.8 mm mercury. There is no aortic insufficiency. There are no other significant abnormalities.   Impression:  The patient is doing well more than 4 months status post minimally invasive aortic valve replacement using a bioprosthetic tissue valve. Late followup echocardiogram looks good. He is clinically doing well.  Plan:  The patient may return to unrestricted physical activity. We have not made any recommendations for changes in his medications. I've reminded the patient regarding the need for long-term antibiotic prophylaxis for all dental procedures in other invasive procedures. The patient will return next fall for routine one year followup.   Salvatore Decentlarence  H. Cornelius Moraswen, MD 05/03/2013 1:57 PM

## 2014-01-31 ENCOUNTER — Ambulatory Visit (INDEPENDENT_AMBULATORY_CARE_PROVIDER_SITE_OTHER): Payer: Medicare HMO | Admitting: Thoracic Surgery (Cardiothoracic Vascular Surgery)

## 2014-01-31 ENCOUNTER — Encounter: Payer: Self-pay | Admitting: Thoracic Surgery (Cardiothoracic Vascular Surgery)

## 2014-01-31 VITALS — BP 119/82 | HR 60 | Resp 20 | Ht 72.0 in | Wt 200.0 lb

## 2014-01-31 DIAGNOSIS — Z954 Presence of other heart-valve replacement: Secondary | ICD-10-CM

## 2014-01-31 DIAGNOSIS — Z953 Presence of xenogenic heart valve: Secondary | ICD-10-CM

## 2014-01-31 NOTE — Patient Instructions (Signed)

## 2014-01-31 NOTE — Progress Notes (Signed)
      [address]       [place] [id number]             [phone]     CARDIOTHORACIC SURGERY OFFICE NOTE  Referring Provider is Sherrill RaringOSARIO, RAYMOND T, MD PCP is Caffie DammeSMITH, KARLA, MD   HPI:  Patient returns for routine followup status post minimally invasive aortic valve replacement using a bioprosthetic tissue valve on 12/24/2012. His postoperative recovery was uncomplicated he was last seen here for followup on 05/03/2013.  Since then he has continued to do well clinically. He reports that he is back to normal physical activity without any significant limitations. He feels much improved in comparison with how he felt prior to surgery. He states that he only gets short of breath with strenuous physical activity, and this does not limit his physical activities whatsoever. This is considerably improved in comparison with prior to surgery. He does not have any exertional chest pain or chest tightness. He has not developed any other medical problems over the past year. He did go back to smoking cigarettes for a period of time but he has now managed to quit again.   Current Outpatient Prescriptions  Medication Sig Dispense Refill  . aspirin EC 81 MG tablet Take 81 mg by mouth daily.    Marland Kitchen. gabapentin (NEURONTIN) 800 MG tablet Take 800 mg by mouth 4 (four) times daily.     Marland Kitchen. ibuprofen (ADVIL,MOTRIN) 800 MG tablet Take 1 tablet (800 mg total) by mouth every 12 (twelve) hours as needed for moderate pain. 30 tablet 2  . metoprolol tartrate (LOPRESSOR) 25 MG tablet Take 0.5 tablets (12.5 mg total) by mouth 2 (two) times daily. 30 tablet 3  . rosuvastatin (CRESTOR) 10 MG tablet Take 10 mg by mouth daily.    Marland Kitchen. venlafaxine XR (EFFEXOR-XR) 75 MG 24 hr capsule Take 75 mg by mouth daily.     No current facility-administered medications for this visit.      Physical Exam:   BP 119/82 mmHg  Pulse 60  Resp 20  Ht 6' (1.829 m)  Wt 200 lb (90.719 kg)  BMI 27.12 kg/m2  SpO2  97%  General:  Well-appearing  Chest:   Clear  CV:   Regular rate and rhythm with soft systolic murmur heard along the right sternal border  Incisions:  Completely healed  Abdomen:  Soft and nontender  Extremities:  Warm and well-perfused  Diagnostic Tests:  n/a   Impression:  Patient is doing well more than one year status post minimally invasive valve replacement using a bioprosthetic tissue valve.   Plan:  The patient will continue to follow up with Dr. Hanley Haysosario for long-term attention to his medical and cardiac needs. He has been reminded regarding the lifelong need for antibiotic prophylaxis for all dental cleaning and related procedures. In the future he will call and return to see us as needed.  I spent in excess of 15 minutes during the conduct of this office consultation and >50% of this time involved direct face-to-face encounter with the patient for counseling and/or coordination of their care.   Salvatore Decentlarence H. Cornelius Moraswen, MD 01/31/2014 1:58 PM

## 2014-02-10 ENCOUNTER — Encounter (HOSPITAL_COMMUNITY): Payer: Self-pay | Admitting: Cardiovascular Disease

## 2014-08-20 IMAGING — CR DG CHEST 2V
2 series · 2 of 2 positions shown · non-contrast
Comparison: 12/08/2012

CLINICAL DATA: Aortic valve disorder.

EXAM:
CHEST  2 VIEW

[w chest pa]
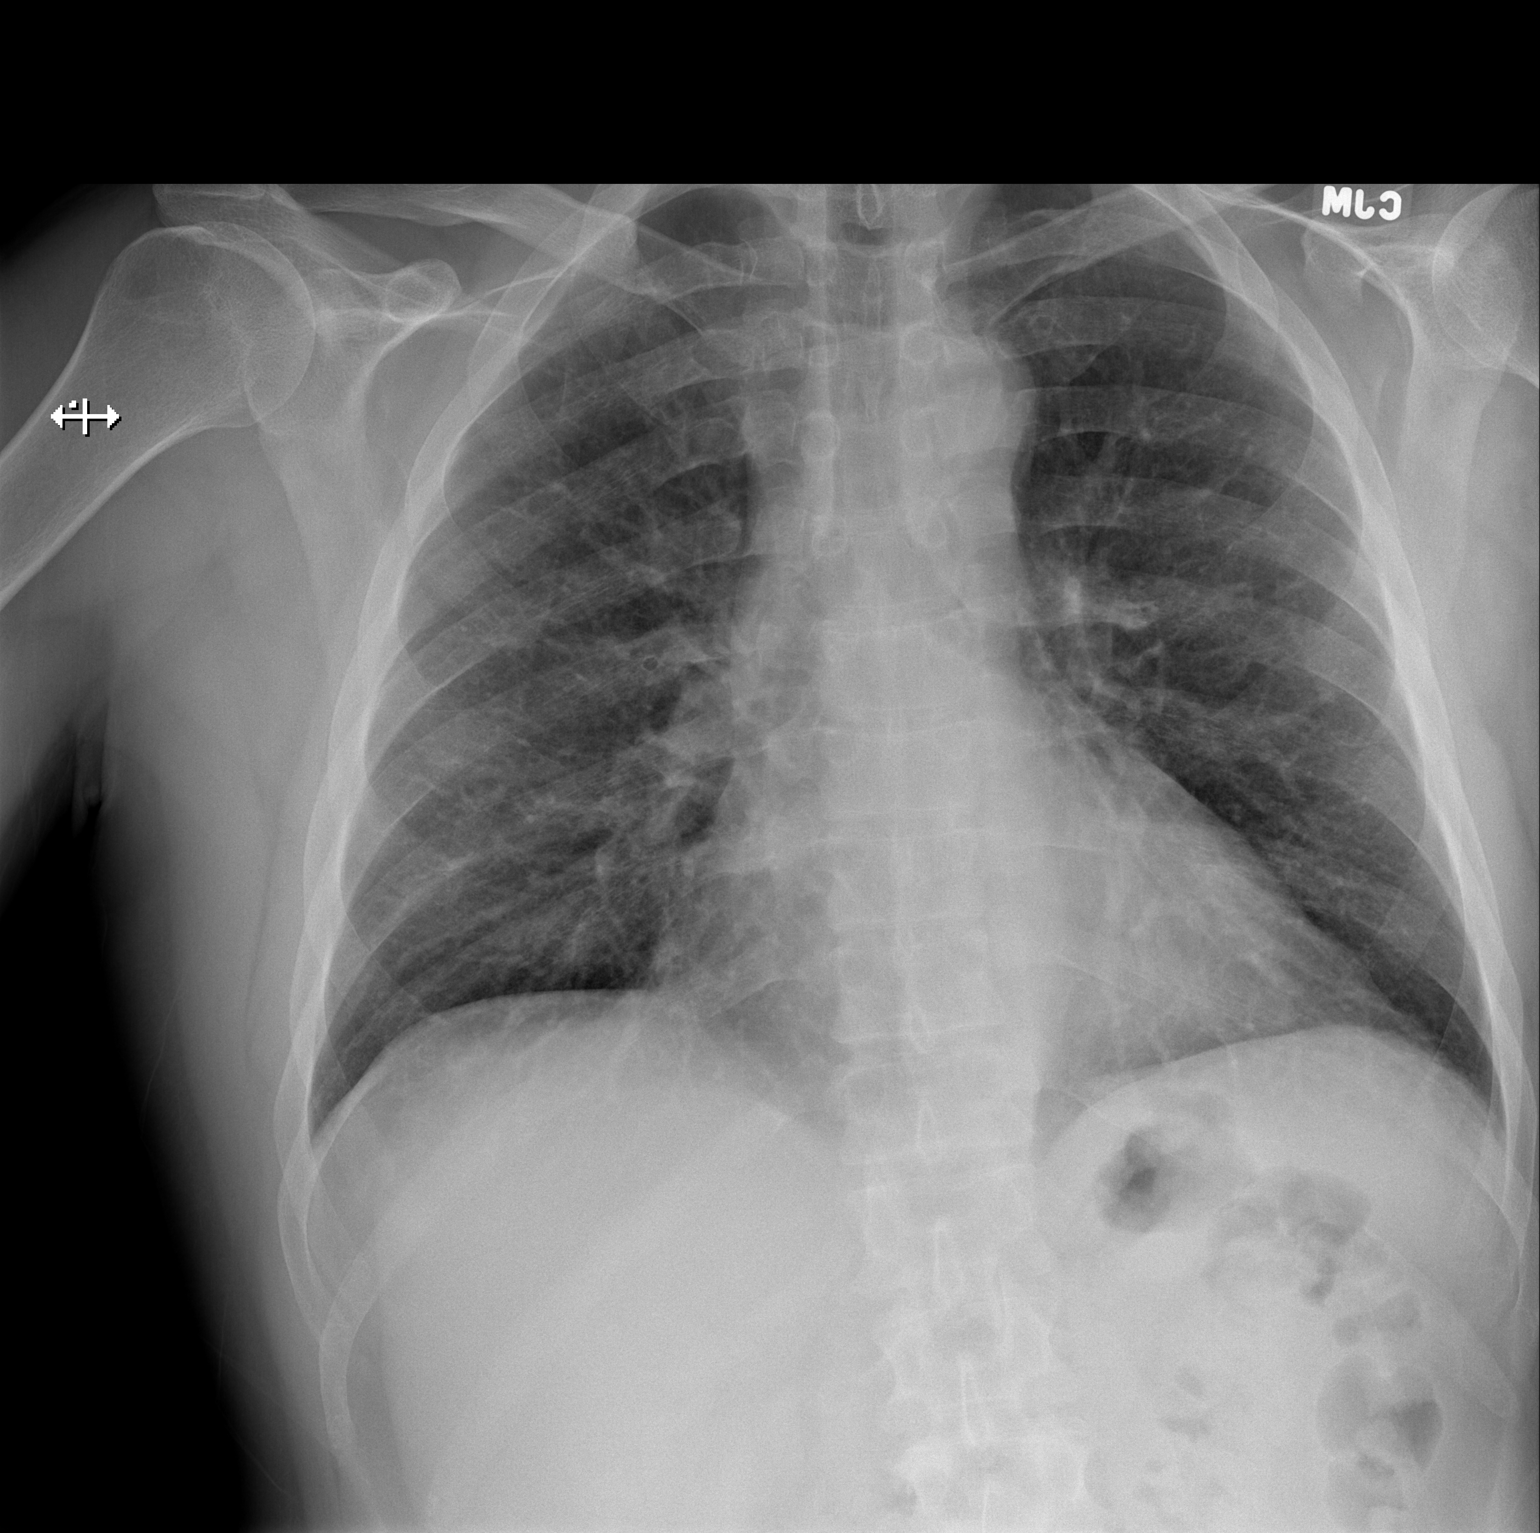

[w chest lat]
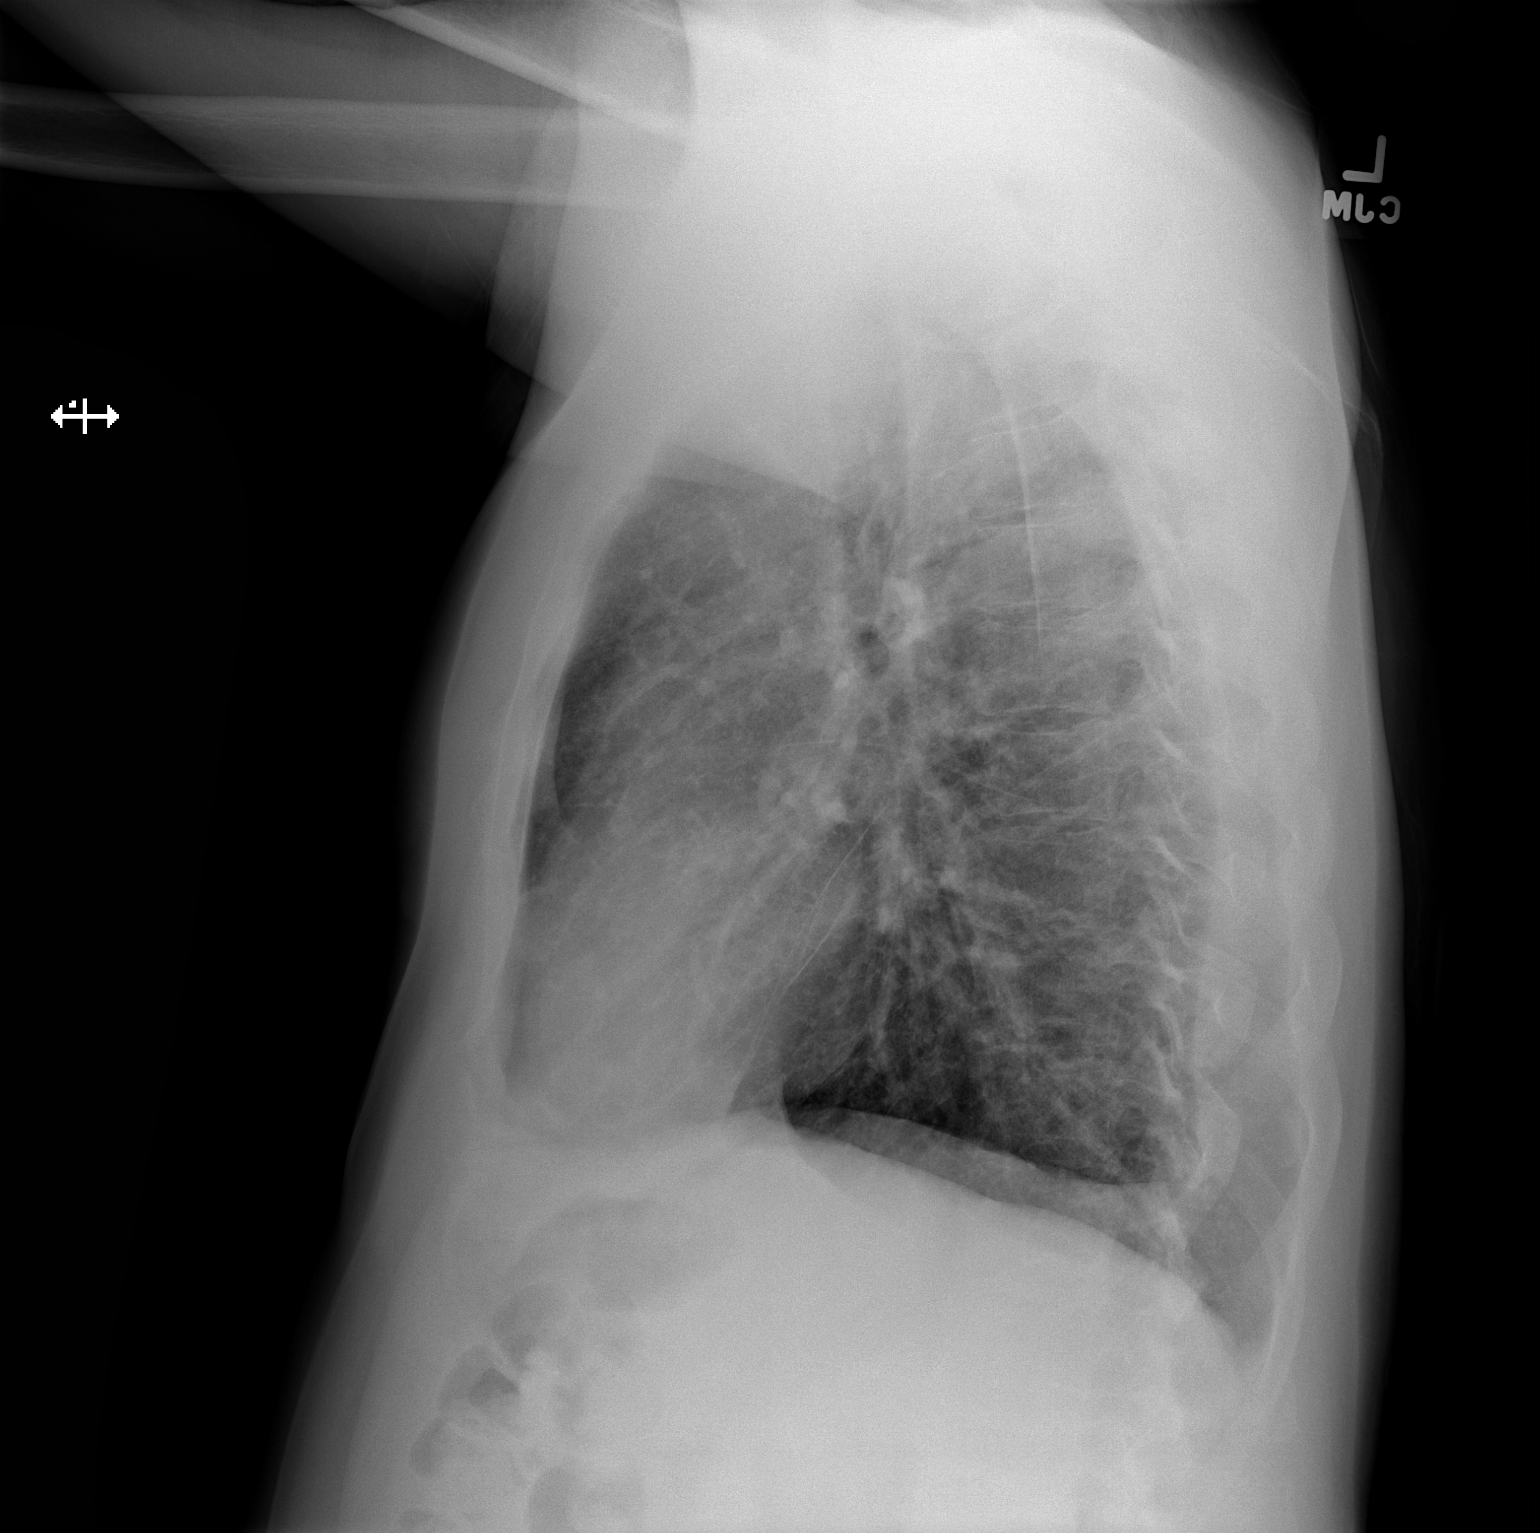

[2 of 2 positions shown; findings below may reference images not displayed]

FINDINGS: Mild cardiomegaly. Low lung volumes. Indistinct vasculature may
reflect pulmonary venous hypertension. No overt edema. No pleural
effusion noted.
IMPRESSION: 1. Cardiomegaly and possible pulmonary venous hypertension. No overt
edema.

## 2014-08-26 IMAGING — CR DG CHEST 2V
2 series · 2 of 2 positions shown · non-contrast
Comparison: 12/27/2012

CLINICAL DATA: Chest pain

EXAM:
CHEST  2 VIEW

[w chest pa]
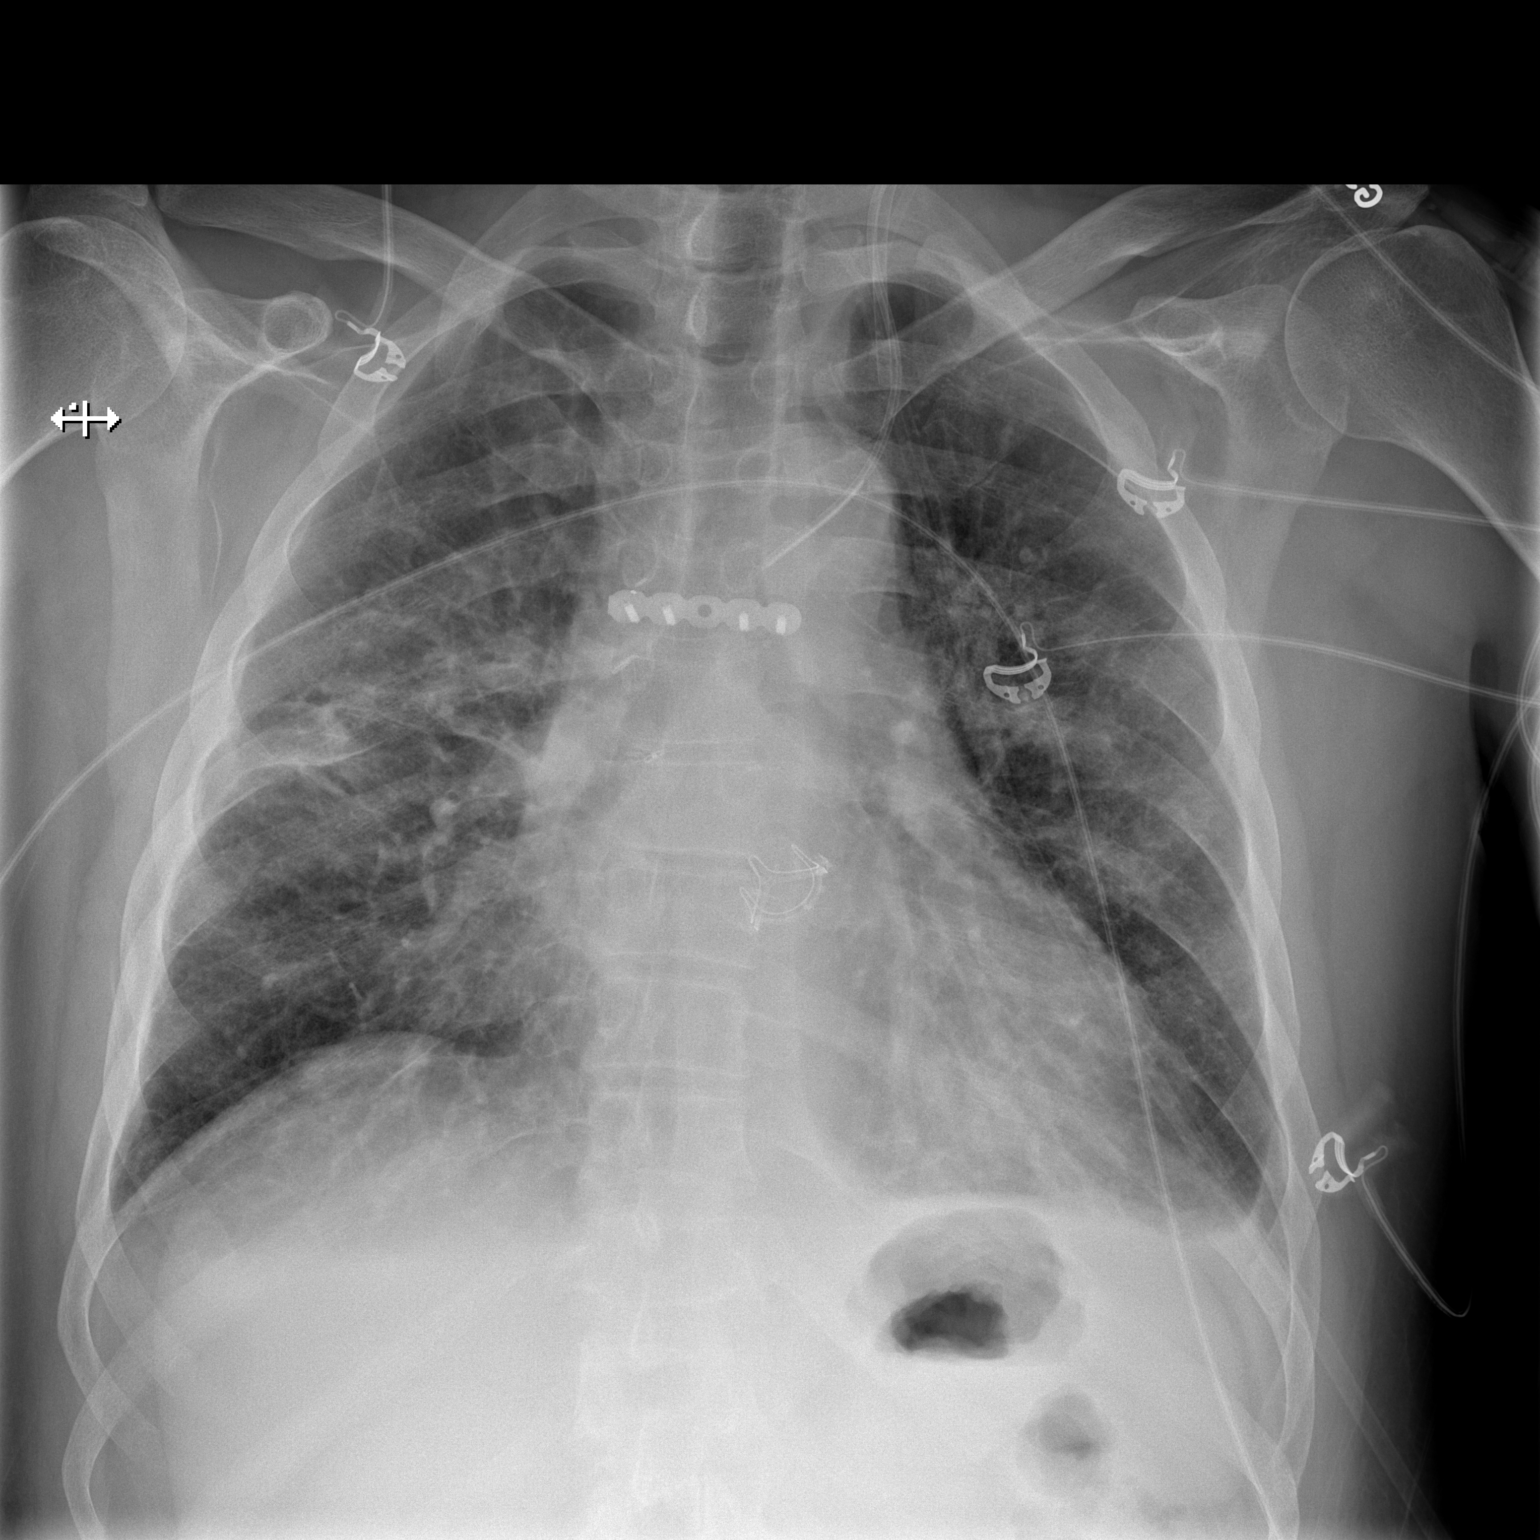

[w chest lat]
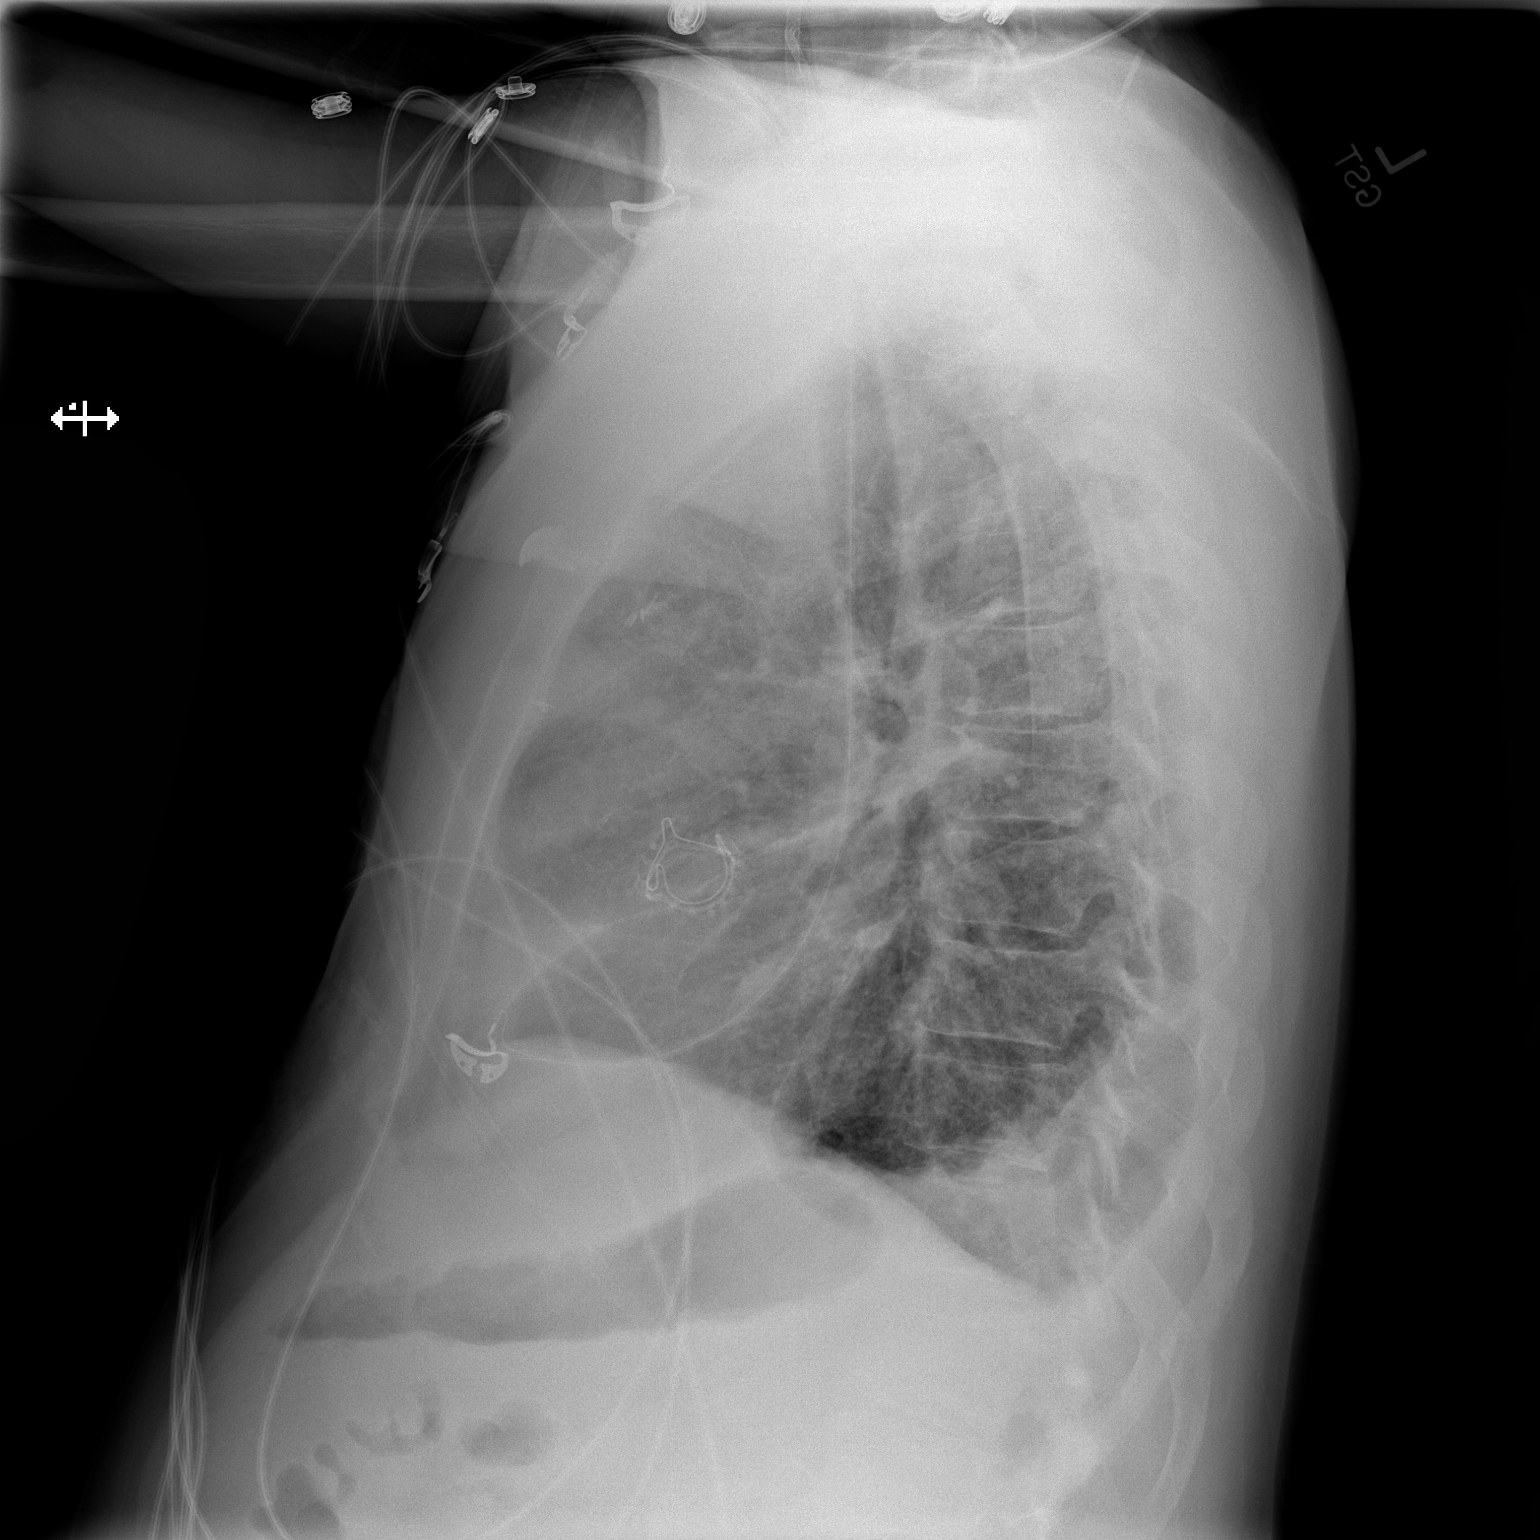

[2 of 2 positions shown; findings below may reference images not displayed]

FINDINGS: Postsurgical changes are again noted. A left-sided central venous
catheter and left sided venous sheath are again identified and
stable. The right-sided chest tube has been removed in the interval.
No pneumothorax is noted. Cardiac shadow is stable. The lungs are
well aerated with evidence of small left-sided pleural effusion.
Mild interstitial changes are again identified bilaterally.
IMPRESSION: Small left pleural effusion. The remainder of the exam is stable
with the exception of removal of a right chest tube.

## 2014-09-13 IMAGING — CR DG CHEST 2V
2 series · 2 of 2 positions shown · non-contrast
Comparison: 12/28/2012.

CLINICAL DATA: Status post AVR, no chest pain.

EXAM:
CHEST  2 VIEW

[view not recorded (1 of 2)]
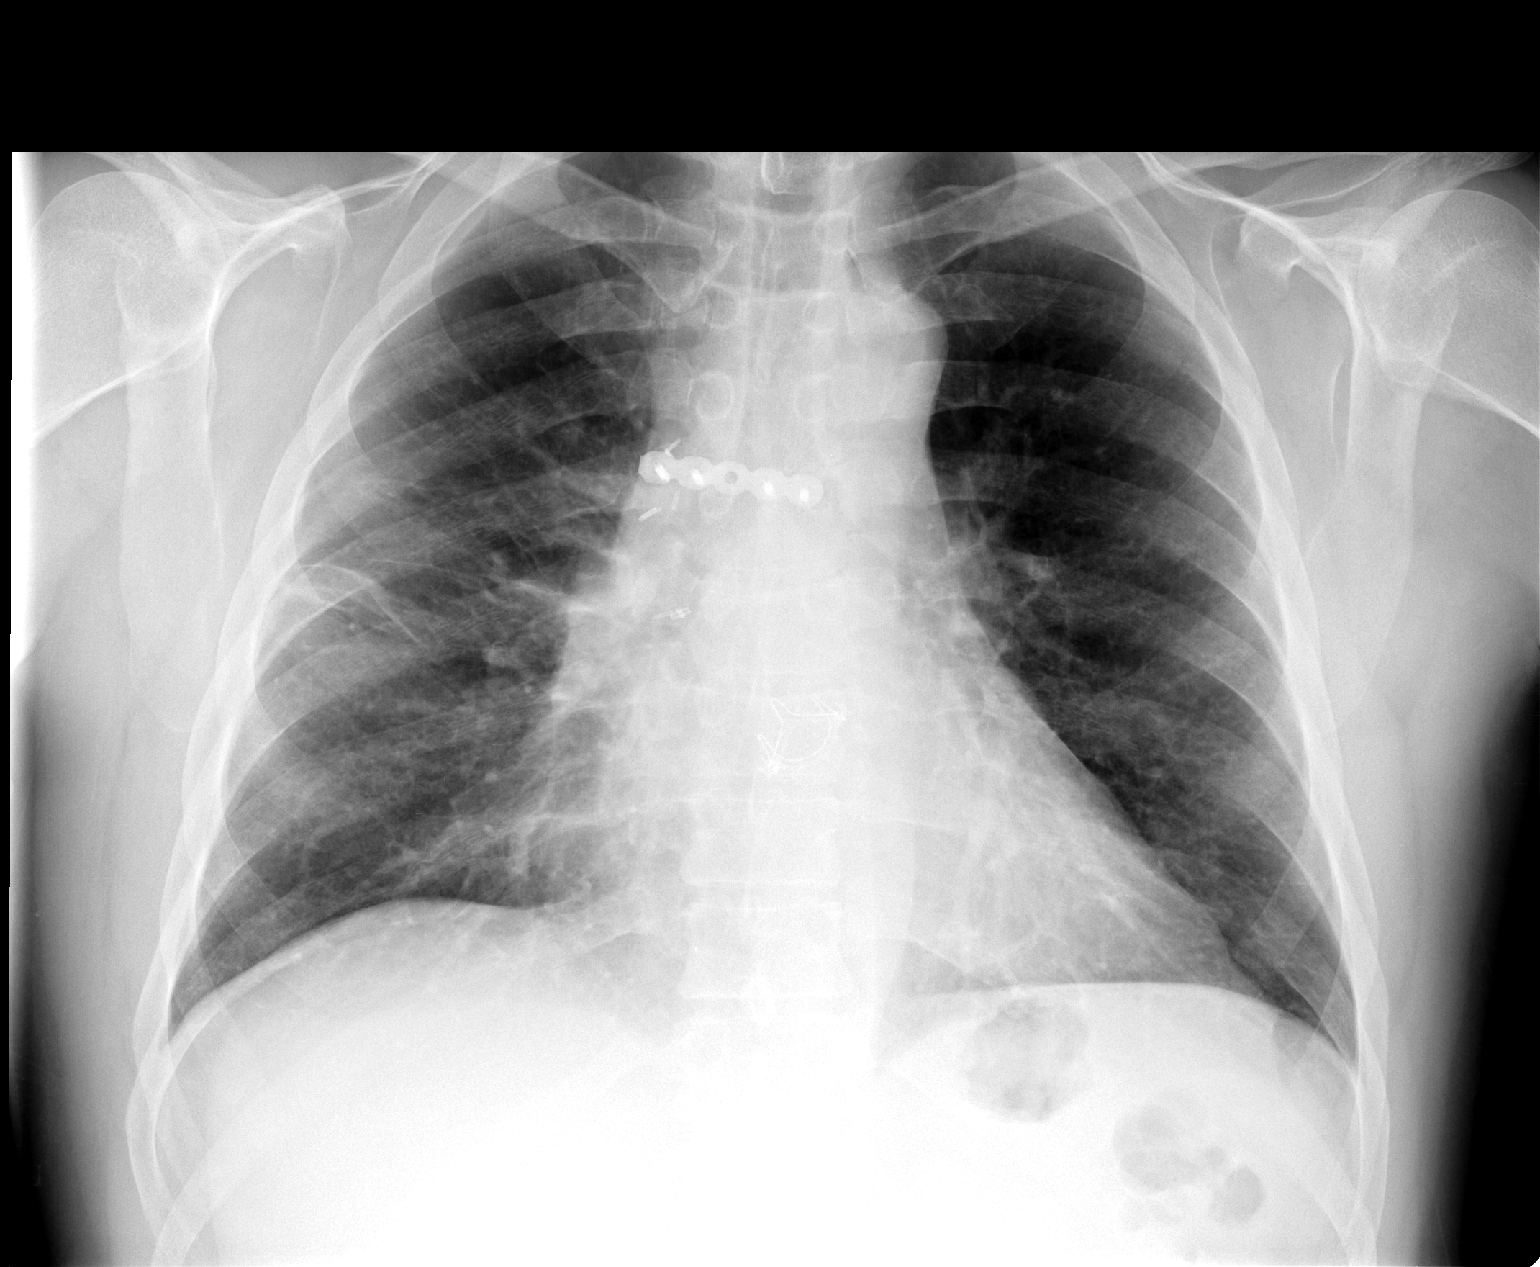

[view not recorded (2 of 2)]
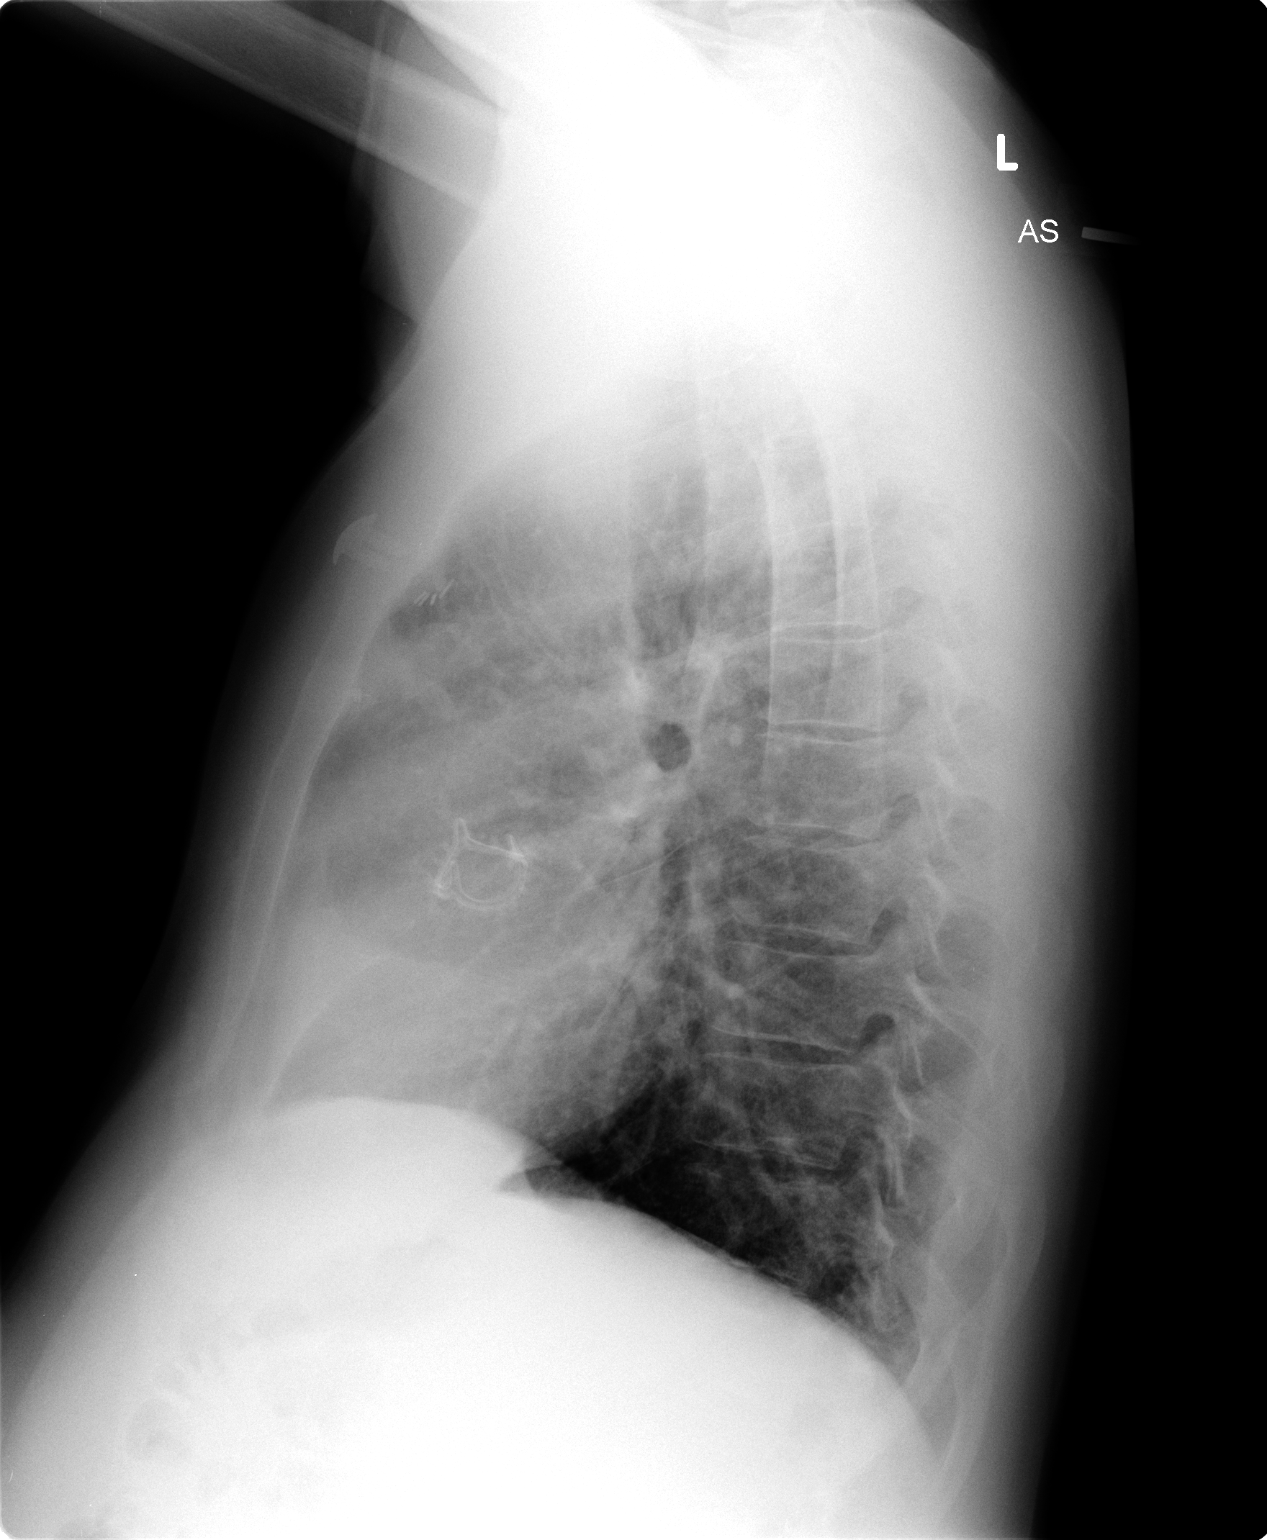

[2 of 2 positions shown; findings below may reference images not displayed]

FINDINGS: The heart size and mediastinal contours are within normal limits.
There is evidence of prior aortic valve replacement. There is a
sternal plate present. There is mild right midlung scarring. There
is no focal consolidation, pleural effusion or pneumothorax. The
visualized skeletal structures are unremarkable.
IMPRESSION: No active cardiopulmonary disease.

## 2016-12-05 ENCOUNTER — Other Ambulatory Visit: Payer: Self-pay | Admitting: Neurosurgery

## 2016-12-05 DIAGNOSIS — M4722 Other spondylosis with radiculopathy, cervical region: Secondary | ICD-10-CM

## 2016-12-16 ENCOUNTER — Ambulatory Visit (INDEPENDENT_AMBULATORY_CARE_PROVIDER_SITE_OTHER): Payer: Medicare HMO

## 2016-12-16 DIAGNOSIS — M4722 Other spondylosis with radiculopathy, cervical region: Secondary | ICD-10-CM | POA: Diagnosis not present

## 2023-05-03 DEATH — deceased
